# Patient Record
Sex: Male | Born: 1970 | Race: White | Hispanic: No | Marital: Married | Smoking: Never smoker
Health system: Southern US, Community
[De-identification: ages and names within clinical notes are randomized; demographics above are authoritative.]

## PROBLEM LIST (undated history)

## (undated) DIAGNOSIS — R21 Rash and other nonspecific skin eruption: Secondary | ICD-10-CM

## (undated) DIAGNOSIS — R0600 Dyspnea, unspecified: Secondary | ICD-10-CM

## (undated) DIAGNOSIS — E785 Hyperlipidemia, unspecified: Secondary | ICD-10-CM

## (undated) DIAGNOSIS — M25572 Pain in left ankle and joints of left foot: Secondary | ICD-10-CM

## (undated) HISTORY — DX: Dyspnea, unspecified: R06.00

## (undated) HISTORY — DX: Pain in left ankle and joints of left foot: M25.572

## (undated) HISTORY — DX: Hyperlipidemia, unspecified: E78.5

## (undated) HISTORY — DX: Rash and other nonspecific skin eruption: R21

## (undated) HISTORY — PX: OTHER SURGICAL HISTORY: SHX169

---

## 2008-02-16 ENCOUNTER — Ambulatory Visit: Payer: Self-pay | Admitting: Family Medicine

## 2008-02-16 DIAGNOSIS — R21 Rash and other nonspecific skin eruption: Secondary | ICD-10-CM

## 2013-11-17 ENCOUNTER — Encounter: Payer: Self-pay | Admitting: Family Medicine

## 2013-11-17 ENCOUNTER — Ambulatory Visit (INDEPENDENT_AMBULATORY_CARE_PROVIDER_SITE_OTHER): Payer: Managed Care, Other (non HMO) | Admitting: Family Medicine

## 2013-11-17 VITALS — BP 90/57 | HR 74 | Resp 16 | Wt 179.0 lb

## 2013-11-17 DIAGNOSIS — IMO0002 Reserved for concepts with insufficient information to code with codable children: Secondary | ICD-10-CM

## 2013-11-17 DIAGNOSIS — Z131 Encounter for screening for diabetes mellitus: Secondary | ICD-10-CM

## 2013-11-17 DIAGNOSIS — Z13220 Encounter for screening for lipoid disorders: Secondary | ICD-10-CM

## 2013-11-17 DIAGNOSIS — N469 Male infertility, unspecified: Secondary | ICD-10-CM

## 2013-11-17 NOTE — Progress Notes (Signed)
CC: Jesse Brock is a 43 y.o. male is here for Establish Care   Subjective: HPI:  Very pleasant 43 year old here to establish care  Patient expresses concerns regarding infertility between he and his wife. They have been married and engaging in protected sex for the past 10 months without any known pregnancy. She may have had a miscarriage many months ago however there was no definitive answer on whether or not this occurred. She has never been pregnant her knowledge nor has he ever had a pregnancy with any other woman. He denies any genitourinary complaints recently or remotely nor trauma to the testicles.  No interventions as of yet. He denies any fatigue or subjective low libido.  He was taking finasteride up until 2-3 months ago for hair loss however is no longer taking this.  Review of Systems - General ROS: negative for - chills, fever, night sweats, weight gain or weight loss Ophthalmic ROS: negative for - decreased vision Psychological ROS: negative for - anxiety or depression ENT ROS: negative for - hearing change, nasal congestion, tinnitus or allergies Hematological and Lymphatic ROS: negative for - bleeding problems, bruising or swollen lymph nodes Breast ROS: negative Respiratory ROS: no cough, shortness of breath, or wheezing Cardiovascular ROS: no chest pain or dyspnea on exertion Gastrointestinal ROS: no abdominal pain, change in bowel habits, or black or bloody stools Genito-Urinary ROS: negative for - genital discharge, genital ulcers, incontinence or abnormal bleeding from genitals Musculoskeletal ROS: negative for - joint pain or muscle pain Neurological ROS: negative for - headaches or memory loss Dermatological ROS: negative for lumps, mole changes, rash and skin lesion changes  History reviewed. No pertinent past medical history.  History reviewed. No pertinent past surgical history. Family History  Problem Relation Age of Onset  . Lung cancer Father   . Cancer  Father     Lung  . Stroke Brother     History   Social History  . Marital Status: Married    Spouse Name: N/A    Number of Children: N/A  . Years of Education: N/A   Occupational History  . Not on file.   Social History Main Topics  . Smoking status: Never Smoker   . Smokeless tobacco: Never Used  . Alcohol Use: No  . Drug Use: No  . Sexual Activity: Yes   Other Topics Concern  . Not on file   Social History Narrative  . No narrative on file     Objective: BP 90/57  Pulse 74  Resp 16  Wt 179 lb (81.194 kg)  SpO2 98%  General: Alert and Oriented, No Acute Distress HEENT: Pupils equal, round, reactive to light. Conjunctivae clear.  Moist mucous membranes pharynx unremarkable Lungs: Clear to auscultation bilaterally, no wheezing/ronchi/rales.  Comfortable work of breathing. Good air movement. Cardiac: Regular rate and rhythm. Normal S1/S2.  No murmurs, rubs, nor gallops.   Extremities: No peripheral edema.  Strong peripheral pulses.  Mental Status: No depression, anxiety, nor agitation. Skin: Warm and dry.  Assessment & Plan: [name] was seen today for establish care.  Diagnoses and associated orders for this visit:  Infertility - Ambulatory referral to Urology  Lipid screening - Lipid panel  Diabetes mellitus screening - BASIC METABOLIC PANEL WITH GFR    Infertility: we'll refer to urology for consideration of semen analysis and further workup. I've encouraged him to stop finasteride indefinitely as a known side effect of this is infertility He is due for routine dyslipidemia and diabetic screening  Return if symptoms worsen or fail to improve.

## 2013-11-20 LAB — BASIC METABOLIC PANEL WITH GFR
BUN: 15 mg/dL (ref 6–23)
CALCIUM: 9.2 mg/dL (ref 8.4–10.5)
CO2: 30 mEq/L (ref 19–32)
Chloride: 102 mEq/L (ref 96–112)
Creat: 0.9 mg/dL (ref 0.50–1.35)
GLUCOSE: 81 mg/dL (ref 70–99)
Potassium: 4.3 mEq/L (ref 3.5–5.3)
SODIUM: 138 meq/L (ref 135–145)

## 2013-11-20 LAB — LIPID PANEL
CHOL/HDL RATIO: 4.4 ratio
Cholesterol: 157 mg/dL (ref 0–200)
HDL: 36 mg/dL — AB (ref >39)
LDL Cholesterol: 101 mg/dL — ABNORMAL HIGH (ref 0–99)
Triglycerides: 100 mg/dL (ref <150)
VLDL: 20 mg/dL (ref 0–40)

## 2013-11-23 ENCOUNTER — Encounter: Payer: Self-pay | Admitting: *Deleted

## 2013-11-29 ENCOUNTER — Telehealth: Payer: Self-pay | Admitting: *Deleted

## 2013-11-29 ENCOUNTER — Encounter: Payer: Self-pay | Admitting: Family Medicine

## 2013-11-29 ENCOUNTER — Ambulatory Visit (INDEPENDENT_AMBULATORY_CARE_PROVIDER_SITE_OTHER): Payer: Managed Care, Other (non HMO) | Admitting: Family Medicine

## 2013-11-29 VITALS — BP 117/72 | HR 64 | Temp 98.7°F | Wt 170.0 lb

## 2013-11-29 DIAGNOSIS — J45909 Unspecified asthma, uncomplicated: Secondary | ICD-10-CM

## 2013-11-29 MED ORDER — PREDNISONE 20 MG PO TABS: ORAL_TABLET | ORAL | Status: AC

## 2013-11-29 NOTE — Telephone Encounter (Signed)
Pt would like a note stating that he was seen today in the office. Pt will come pick up the letter in the am.letter printed and place up front

## 2013-11-29 NOTE — Progress Notes (Signed)
CC: Jesse Brock is a 43 y.o. male is here for chest congestion?   Subjective: HPI:  Complains of chest tightness and sensation of not being able to get a full breath of air that has been present since Sunday not getting better since onset mild to moderate in severity present all hours of the day. Came on abruptly Sunday morning less than one day after painting his hallway and poor ventilation. He reports mild chest congestion but a nonproductive cough. He and his wife have noted wheezing since the above symptoms began. He denies exertional chest pain nor irregular heartbeat. Denies fevers, chills, flushing, abdominal pain, confusion, sore throat, sinus pressure. Review of systems is positive for mild nasal congestion   Review Of Systems Outlined In HPI  No past medical history on file.  No past surgical history on file. Family History  Problem Relation Age of Onset  . Lung cancer Father   . Cancer Father     Lung  . Stroke Brother     History   Social History  . Marital Status: Married    Spouse Name: N/A    Number of Children: N/A  . Years of Education: N/A   Occupational History  . Not on file.   Social History Main Topics  . Smoking status: Never Smoker   . Smokeless tobacco: Never Used  . Alcohol Use: No  . Drug Use: No  . Sexual Activity: Yes   Other Topics Concern  . Not on file   Social History Narrative  . No narrative on file     Objective: BP 117/72  Pulse 64  Temp(Src) 98.7 F (37.1 C) (Oral)  Wt 170 lb (77.111 kg)  SpO2 99%  General: Alert and Oriented, No Acute Distress HEENT: Pupils equal, round, reactive to light. Conjunctivae clear.  External ears unremarkable, canals clear with intact TMs with appropriate landmarks.  Middle ear appears open without effusion. Pink inferior turbinates with scant clear discharge.  Moist mucous membranes, pharynx without inflammation nor lesions.  Neck supple without palpable lymphadenopathy nor abnormal  masses. Lungs: Clear to auscultation bilaterally, no wheezing/ronchi/rales.  Comfortable work of breathing. Good air movement. Cardiac: Regular rate and rhythm. Normal S1/S2.  No murmurs, rubs, nor gallops.   Mental Status: No depression, anxiety, nor agitation. Skin: Warm and dry.  Assessment & Plan: Jesse Brock was seen today for chest congestion?.  Diagnoses and associated orders for this visit:  Reactive airway disease - predniSONE (DELTASONE) 20 MG tablet; Two tabs at once daily for five days.    Suspect reactive airway disease therefore start moderate dose of prednisone call if any decline in overall health, very low suspicion of viral or bacterial infection at this time  Return if symptoms worsen or fail to improve.

## 2013-11-30 ENCOUNTER — Encounter: Payer: Self-pay | Admitting: Family Medicine

## 2014-01-31 ENCOUNTER — Telehealth: Payer: Self-pay

## 2014-01-31 ENCOUNTER — Ambulatory Visit (INDEPENDENT_AMBULATORY_CARE_PROVIDER_SITE_OTHER): Payer: Managed Care, Other (non HMO) | Admitting: Family Medicine

## 2014-01-31 ENCOUNTER — Ambulatory Visit (INDEPENDENT_AMBULATORY_CARE_PROVIDER_SITE_OTHER): Payer: Managed Care, Other (non HMO)

## 2014-01-31 ENCOUNTER — Encounter: Payer: Self-pay | Admitting: Family Medicine

## 2014-01-31 VITALS — BP 111/75 | HR 63 | Wt 166.0 lb

## 2014-01-31 DIAGNOSIS — M25579 Pain in unspecified ankle and joints of unspecified foot: Secondary | ICD-10-CM

## 2014-01-31 DIAGNOSIS — M25572 Pain in left ankle and joints of left foot: Secondary | ICD-10-CM

## 2014-01-31 DIAGNOSIS — T148 Other injury of unspecified body region: Secondary | ICD-10-CM

## 2014-01-31 DIAGNOSIS — M25473 Effusion, unspecified ankle: Secondary | ICD-10-CM

## 2014-01-31 DIAGNOSIS — W57XXXA Bitten or stung by nonvenomous insect and other nonvenomous arthropods, initial encounter: Secondary | ICD-10-CM

## 2014-01-31 DIAGNOSIS — M25476 Effusion, unspecified foot: Secondary | ICD-10-CM

## 2014-01-31 DIAGNOSIS — M7730 Calcaneal spur, unspecified foot: Secondary | ICD-10-CM

## 2014-01-31 NOTE — Telephone Encounter (Signed)
Jesse Brock called and left a voice mail stating he needs to come in for an office visit. I called him back and left a message that we do have openings and for him to call back to schedule an appointment.

## 2014-01-31 NOTE — Patient Instructions (Signed)
Wear the air-stirup splint on a daily basis whenever you or bearing weight on her left ankle. Continue to rest the ankle, elevated when resting, ice it for 20 minutes a time every 4-5 hours for the next 2 days.  You can use ibuprofen 800 mg over-the-counter 3 times a day as needed for pain control and swelling control.  I think you're going to need to use this splint for 2 weeks. No softball for the next 2 weeks.  Followup with me in 2 weeks if pain is persistent.

## 2014-01-31 NOTE — Progress Notes (Signed)
CC: Jesse Brock is a 43 y.o. male is here for left ankle injury   Subjective: HPI:  Patient complains of left ankle pain that has been present since last night. It came on acutely after he was sliding into third base at a softball match and twisted his ankle. He is uncertain if it was inversion or eversion injury. He was able to bear weight however had immediate pain on the distal lateral and medial malleoli that was nonradiating. Swelling occurred within minutes. Pain is described only as pain moderate in severity not influenced by weightbearing.  Symptoms improved with icing, compression, elevation overnight however pain is persistent.  Other than swelling denies any overlying skin changes. Pain is described only as pain.  Complains of swelling and redness overlying his left lateral hip has been present ever since a tick was removed on Sunday. His wife believes that she did not get the full tick and that the head may still be buried. Patient denies any recent or remote fevers, chills, myalgias, nor joint pain other than that described above. Denies rashes, headache, nor nausea. No discharge or tenderness at the site of swelling and redness.   Review Of Systems Outlined In HPI  No past medical history on file.  No past surgical history on file. Family History  Problem Relation Age of Onset  . Lung cancer Father   . Cancer Father     Lung  . Stroke Brother     History   Social History  . Marital Status: Married    Spouse Name: N/A    Number of Children: N/A  . Years of Education: N/A   Occupational History  . Not on file.   Social History Main Topics  . Smoking status: Never Smoker   . Smokeless tobacco: Never Used  . Alcohol Use: No  . Drug Use: No  . Sexual Activity: Yes   Other Topics Concern  . Not on file   Social History Narrative  . No narrative on file     Objective: BP 111/75  Pulse 63  Wt 166 lb (75.297 kg)  General: Alert and Oriented, No Acute  Distress HEENT: Pupils equal, round, reactive to light. Conjunctivae clear other than a mild stye on the right inferior eyelid.  Moist because membranes are unremarkable Lungs: Clear to auscultation bilaterally, no wheezing/ronchi/rales.  Comfortable work of breathing. Good air movement. Cardiac: Regular rate and rhythm. Normal S1/S2.  No murmurs, rubs, nor gallops.   Extremities: No peripheral edema.  Strong peripheral pulses. Exam of the left foot reveals reproduction of pain with palpation of distal medial or lateral malleoli. No pain over the navicular nor base of fifth metatarsal. Pain is not reproduced with compression of tibia-fibula in the shin. There is a remarkable swelling overlying the medial and lateral malleoli but no bruising. Anterior drawer negative Mental Status: No depression, anxiety, nor agitation. Skin: Warm and dry. 2.5 centimeter diameter patch of erythema with a central scab, slightly tender to touch  Assessment & Plan: Jesse Brock was seen today for left ankle injury.  Diagnoses and associated orders for this visit:  Left ankle pain - DG Ankle Complete Left; Future  Tick bite    X-rays were obtained of the left ankle fortunate showing no sign of avulsion. He was strapped with a air cast splint and encouraged to wear this on a daily basis whenever on his feet, focus on rest, ice, compression, elevation. Ibuprofen as needed for pain. Return if not significantly improved  by 2 weeks. Tick bite: Due to the suspicion of the retained head of a tick 1% lidocaine with epinephrine was used to anesthetize the skin surrounding the scab on his left lateral hip.  Using an 18-gauge needle black debris along with the scab was easily removed.  Wound was covered with antibiotic ointment and a Band-Aid and encouraged the family to do this on a daily basis. The perimeter of erythema was marked with a surgical pen and asked him to call me if erythema extends beyond this as this would be  suggestive of a staph or strep infection requiring antibiotics.  40 minutes spent face-to-face during visit today of which at least 50% was counseling or coordinating care regarding: 1. Left ankle pain   2. Tick bite       Return if symptoms worsen or fail to improve.

## 2014-03-09 ENCOUNTER — Ambulatory Visit (INDEPENDENT_AMBULATORY_CARE_PROVIDER_SITE_OTHER): Payer: Managed Care, Other (non HMO)

## 2014-03-09 ENCOUNTER — Encounter: Payer: Self-pay | Admitting: Family Medicine

## 2014-03-09 ENCOUNTER — Ambulatory Visit (INDEPENDENT_AMBULATORY_CARE_PROVIDER_SITE_OTHER): Payer: Managed Care, Other (non HMO) | Admitting: Family Medicine

## 2014-03-09 VITALS — BP 114/72 | HR 63 | Wt 165.0 lb

## 2014-03-09 DIAGNOSIS — R0602 Shortness of breath: Secondary | ICD-10-CM

## 2014-03-09 MED ORDER — BECLOMETHASONE DIPROPIONATE 80 MCG/ACT IN AERS: 1.0000 | INHALATION_SPRAY | Freq: Two times a day (BID) | RESPIRATORY_TRACT | Status: DC

## 2014-03-09 NOTE — Progress Notes (Signed)
CC: Jesse Brock is a 43 y.o. male is here for Shortness of Breath   Subjective: HPI:  Reports shortness of breath that has been present on a daily basis for the past 3 months. He can occur with exertion or at rest. It is not predictable with respect to environment, only coming on with a certain level of exertion or time of day. It can occur anytime of the day. It does not interfere with his sleep.  Symptoms began after exposure to paint fumes 3 months ago. Nothing particularly makes symptoms better or worse currently. When the episodes come on the last matter of minutes and are described as sensation that he cannot get a full deep breath and often reports he has to gasp for air.  Symptoms have not been getting better or worse since onset. Denies cough, fevers, chills, wheezing, productive cough, exertional chest pain, orthopnea, peripheral edema, nasal congestion or watery eyes.   Review Of Systems Outlined In HPI  No past medical history on file.  No past surgical history on file. Family History  Problem Relation Age of Onset  . Lung cancer Father   . Cancer Father     Lung  . Stroke Brother     History   Social History  . Marital Status: Married    Spouse Name: N/A    Number of Children: N/A  . Years of Education: N/A   Occupational History  . Not on file.   Social History Main Topics  . Smoking status: Never Smoker   . Smokeless tobacco: Never Used  . Alcohol Use: No  . Drug Use: No  . Sexual Activity: Yes   Other Topics Concern  . Not on file   Social History Narrative  . No narrative on file     Objective: BP 114/72  Pulse 63  Wt 165 lb (74.844 kg)  SpO2 98%  General: Alert and Oriented, No Acute Distress HEENT: Pupils equal, round, reactive to light. Conjunctivae clear.  External ears unremarkable, canals clear with intact TMs with appropriate landmarks.  Middle ear appears open without effusion. Pink inferior turbinates.  Moist mucous membranes, pharynx  without inflammation nor lesions.  Neck supple without palpable lymphadenopathy nor abnormal masses. Lungs: Clear to auscultation bilaterally, no wheezing/ronchi/rales.  Comfortable work of breathing. Good air movement. Cardiac: Regular rate and rhythm. Normal S1/S2.  No murmurs, rubs, nor gallops.   Extremities: No peripheral edema.  Strong peripheral pulses.  Mental Status: No depression, anxiety, nor agitation. Skin: Warm and dry.  Assessment & Plan: Jesse FearingJames was seen today for shortness of breath.  Diagnoses and associated orders for this visit:  Shortness of breath - DG Chest 2 View; Future  Other Orders - beclomethasone (QVAR) 80 MCG/ACT inhaler; Inhale 1 puff into the lungs 2 (two) times daily.    Obtain chest x-ray given chronicity of symptoms, looking for cardiomegaly, reactive airway disease remains high on the differential therefore start samples of Qvar for both therapeutic and diagnostic benefits. I've asked him to call me next week with the response.   Return if symptoms worsen or fail to improve.

## 2014-04-18 ENCOUNTER — Telehealth: Payer: Self-pay | Admitting: Family Medicine

## 2014-04-18 DIAGNOSIS — R0602 Shortness of breath: Secondary | ICD-10-CM

## 2014-04-18 NOTE — Telephone Encounter (Signed)
Is requesting to see a pulmonary dr.

## 2014-04-18 NOTE — Telephone Encounter (Signed)
Jesse Brock, Referral has been placed. 

## 2014-04-19 NOTE — Telephone Encounter (Signed)
Left message on vm

## 2014-05-10 ENCOUNTER — Institutional Professional Consult (permissible substitution): Payer: Managed Care, Other (non HMO) | Admitting: Pulmonary Disease

## 2014-05-10 ENCOUNTER — Ambulatory Visit (INDEPENDENT_AMBULATORY_CARE_PROVIDER_SITE_OTHER): Payer: Managed Care, Other (non HMO) | Admitting: Pulmonary Disease

## 2014-05-10 ENCOUNTER — Encounter: Payer: Self-pay | Admitting: Pulmonary Disease

## 2014-05-10 VITALS — BP 110/68 | HR 70 | Temp 98.2°F | Ht 69.0 in | Wt 169.8 lb

## 2014-05-10 DIAGNOSIS — R0989 Other specified symptoms and signs involving the circulatory and respiratory systems: Secondary | ICD-10-CM

## 2014-05-10 DIAGNOSIS — R0609 Other forms of dyspnea: Secondary | ICD-10-CM

## 2014-05-10 DIAGNOSIS — R0600 Dyspnea, unspecified: Secondary | ICD-10-CM | POA: Insufficient documentation

## 2014-05-10 NOTE — Patient Instructions (Signed)
CXR is clear Breathing test shows good lung function Treatment trial for reflux- Take pepcid 20 mg twice daily x 4 weeks, call if no better

## 2014-05-10 NOTE — Progress Notes (Signed)
   Subjective:    Patient ID: Jesse Brock, male    DOB: Feb 01, 1971, 43 y.o.   MRN: [id number]  HPI PCP - hommel 43 year old never smoker, presents for evaluation of dyspnea since March 2015. He reports an episode that woke him up from sleep and lasted for about 2 hours before subsiding spontaneously. He also reports episodes after eating a large meal. He feels that he has some sputum in the back of his throat that he is to clear. He sometimes hears a sound while breathing. He is able to play basketball twice a week however and is not dyspneic while on the court. He denies substernal burning. He clarifies that this is not exactly shortness of breath but more a sensation of heaviness and congestion in his throat. He admits to some anxiety surrounding his symptoms, and felt reassured when he found that his chest x-ray was normal. He denies childhood history of asthma or wheezing. There is no exercise-induced symptoms. There is no history of seasonal allergies. Wife has noted some snoring but not witnessed apneas. 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 He denies any stressors at work or home. Spirometry showed no evidence of airway obstruction, good lung function, expiratory limb shows flattening.  No past medical history on file.  No past surgical history on file.  No Known Allergies  History   Social History  . Marital Status: Married    Spouse Name: N/A    Number of Children: N/A  . Years of Education: N/A   Occupational History  . Not on file.   Social History Main Topics  . Smoking status: Never Smoker   . Smokeless tobacco: Never Used  . Alcohol Use: No  . Drug Use: No  . Sexual Activity: Yes   Other Topics Concern  . Not on file   Social History Narrative  . No narrative on file    Family History  Problem Relation Age of Onset  . Lung cancer Father     smoked  . Stroke Brother       Review of  Systems Constitutional: negative for anorexia, fevers and sweats  Eyes: negative for irritation, redness and visual disturbance  Ears, nose, mouth, throat, and face: negative for earaches, epistaxis, nasal congestion and sore throat  Respiratory: negative for cough, dyspnea on exertion, sputum and wheezing  Cardiovascular: negative for chest pain, dyspnea, lower extremity edema, orthopnea, palpitations and syncope  Gastrointestinal: negative for abdominal pain, constipation, diarrhea, melena, nausea and vomiting  Genitourinary:negative for dysuria, frequency and hematuria  Hematologic/lymphatic: negative for bleeding, easy bruising and lymphadenopathy  Musculoskeletal:negative for arthralgias, muscle weakness and stiff joints  Neurological: negative for coordination problems, gait problems, headaches and weakness  Endocrine: negative for diabetic symptoms including polydipsia, polyuria and weight loss     Objective:   Physical Exam  Gen. Pleasant, well-nourished, in no distress, normal affect ENT - no lesions, no post nasal drip Neck: No JVD, no thyromegaly, no carotid bruits Lungs: no use of accessory muscles, no dullness to percussion, clear without rales or rhonchi  Cardiovascular: Rhythm regular, heart sounds  normal, no murmurs or gallops, no peripheral edema Abdomen: soft and non-tender, no hepatosplenomegaly, BS normal. Musculoskeletal: No deformities, no cyanosis or clubbing Neuro:  alert, non focal        Assessment & Plan:

## 2014-05-10 NOTE — Assessment & Plan Note (Signed)
His symptoms seem to be worse at rest and on exertion, etiology is not clear but reflux appears most likely. He does not seem to have allergies CXR is clear spirometry shows good lung function,no airway obstruction, although some flattening of the expiratory limb is noted. Treatment trial for reflux- Take pepcid 20 mg twice daily x 4 weeks, call if no better and we can pursue further workup

## 2014-06-22 ENCOUNTER — Encounter: Payer: Self-pay | Admitting: Family Medicine

## 2014-06-22 ENCOUNTER — Ambulatory Visit (INDEPENDENT_AMBULATORY_CARE_PROVIDER_SITE_OTHER): Payer: Managed Care, Other (non HMO) | Admitting: Family Medicine

## 2014-06-22 VITALS — BP 101/58 | HR 66 | Ht 69.0 in | Wt 168.0 lb

## 2014-06-22 DIAGNOSIS — M25572 Pain in left ankle and joints of left foot: Secondary | ICD-10-CM

## 2014-06-22 NOTE — Progress Notes (Signed)
CC: Jesse Brock is a 43 y.o. male is here for Ankle Injury   Subjective: HPI:  Complains of left ankle pain that has been present since May of this year. Symptoms have been moderately persistent over the summer. He improved a few weeks after an inversion injury with rest however no improvement over the past 3-4 months. Nothing particularly makes the pain better or worse. He is still able to play recreational basketball however symptoms are present on a daily basis. No benefits from home rehabilitative exercises. There is moderate swelling on a daily basis localized on the lateral side ankle. Pain is nonradiating and described only has pain. Denies weakness or motor or sensory disturbances in the left lower extremity. Denies any overlying skin changes other than that described above  Review Of Systems Outlined In HPI  No past medical history on file.  No past surgical history on file. Family History  Problem Relation Age of Onset  . Lung cancer Father     smoked  . Stroke Brother     History   Social History  . Marital Status: Married    Spouse Name: N/A    Number of Children: N/A  . Years of Education: N/A   Occupational History  . Not on file.   Social History Main Topics  . Smoking status: Never Smoker   . Smokeless tobacco: Never Used  . Alcohol Use: No  . Drug Use: No  . Sexual Activity: Yes   Other Topics Concern  . Not on file   Social History Narrative  . No narrative on file     Objective: BP 101/58  Pulse 66  Ht 5\' 9"  (1.753 m)  Wt 168 lb (76.204 kg)  BMI 24.80 kg/m2  General: Alert and Oriented, No Acute Distress HEENT: Pupils equal, round, reactive to light. Conjunctivae clear.  Moist mucous membranes Lungs: Clear and comfortable work of breathing Cardiac: Regular rate and rhythm.  Extremities: No peripheral edema.  Strong peripheral pulses. Exam of the left ankle shows negative anterior drawer, no pain at the base of the fifth metatarsal, no pain  with palpation of the medial malleoli, mild to moderate swelling over the lateral malleoli. No pain in her ankle with compression of the tibia and tibia. Pain is reproduced with palpation of the anterior and posterior lateral malleoli and with resisted ankle eversion. Mental Status: No depression, anxiety, nor agitation. Skin: Warm and dry.  Assessment & Plan: Jesse Brock was seen today for ankle injury.  Diagnoses and associated orders for this visit:  Left ankle pain - MR Ankle Left  Wo Contrast; Future    Persistent left ankle pain despite conservative measures. Obtain MRI to determine if ligamentous, bone or cartilage damage could benefit from any surgical intervention.   Return if symptoms worsen or fail to improve.

## 2014-06-25 ENCOUNTER — Telehealth: Payer: Self-pay

## 2014-06-25 NOTE — Telephone Encounter (Signed)
PA for MRI approved for [facility] authorization # [id number]

## 2014-07-06 ENCOUNTER — Telehealth: Payer: Self-pay | Admitting: Family Medicine

## 2014-07-06 DIAGNOSIS — M19172 Post-traumatic osteoarthritis, left ankle and foot: Secondary | ICD-10-CM

## 2014-07-06 DIAGNOSIS — M65979 Unspecified synovitis and tenosynovitis, unspecified ankle and foot: Secondary | ICD-10-CM

## 2014-07-06 DIAGNOSIS — M25572 Pain in left ankle and joints of left foot: Secondary | ICD-10-CM | POA: Insufficient documentation

## 2014-07-06 DIAGNOSIS — S93422A Sprain of deltoid ligament of left ankle, initial encounter: Secondary | ICD-10-CM

## 2014-07-06 DIAGNOSIS — M659 Synovitis and tenosynovitis, unspecified: Secondary | ICD-10-CM

## 2014-07-06 NOTE — Telephone Encounter (Signed)
Contacted patient regarding recent MRI results. Discussed case with Dr. Karie Schwalbe. who has proposed immobilization with casting. I left a voicemail on the patient's answering machine with this advice and urged him to call us to be scheduled with Dr. Karie Schwalbe. at his convenience.

## 2014-07-11 ENCOUNTER — Encounter: Payer: Self-pay | Admitting: Family Medicine

## 2014-07-12 ENCOUNTER — Encounter: Payer: Self-pay | Admitting: Sports Medicine

## 2014-07-12 ENCOUNTER — Ambulatory Visit (INDEPENDENT_AMBULATORY_CARE_PROVIDER_SITE_OTHER): Payer: Managed Care, Other (non HMO) | Admitting: Sports Medicine

## 2014-07-12 VITALS — BP 90/55 | HR 67 | Ht 69.0 in | Wt 168.0 lb

## 2014-07-12 DIAGNOSIS — M25572 Pain in left ankle and joints of left foot: Secondary | ICD-10-CM

## 2014-07-12 DIAGNOSIS — G5752 Tarsal tunnel syndrome, left lower limb: Secondary | ICD-10-CM

## 2014-07-12 MED ORDER — MELOXICAM 15 MG PO TABS: ORAL_TABLET | ORAL | Status: DC

## 2014-07-12 NOTE — Progress Notes (Signed)
   Subjective:    I'm seeing this patient as a consultation for:  Dr. Ivan AnchorsHommel  CC: Left ankle pain  HPI: This is a very pleasant 43 year old male, he inverted his ankle approximately 5 months ago, unfortunately he has had persistent swelling with only minimal pain since. The swelling has been localized over the sinus tarsi. He has not yet been immobilized or done physical therapy. He was seen by his primary care physician who ordered an MRI, and he was referred to me for further evaluation and definitive treatment.  Past medical history, Surgical history, Family history not pertinant except as noted below, Social history, Allergies, and medications have been entered into the medical record, reviewed, and no changes needed.   Review of Systems: No headache, visual changes, nausea, vomiting, diarrhea, constipation, dizziness, abdominal pain, skin rash, fevers, chills, night sweats, weight loss, swollen lymph nodes, body aches, joint swelling, muscle aches, chest pain, shortness of breath, mood changes, visual or auditory hallucinations.   Objective:   General: Well Developed, well nourished, and in no acute distress.  Neuro/Psych: Alert and oriented x3, extra-ocular muscles intact, able to move all 4 extremities, sensation grossly intact. Skin: Warm and dry, no rashes noted.  Respiratory: Not using accessory muscles, speaking in full sentences, trachea midline.  Cardiovascular: Pulses palpable, no extremity edema. Abdomen: Does not appear distended. Left Ankle: Visibly swollen with tenderness to palpation of sinus tarsi/anterior/lateral talocrural joint Range of motion is full in all directions. Strength is 5/5 in all directions. Stable lateral and medial ligaments; squeeze test and kleiger test unremarkable; Talar dome nontender; No pain at base of 5th MT; No tenderness over cuboid; No tenderness over N spot or navicular prominence No tenderness on posterior aspects of lateral and medial  malleolus No sign of peroneal tendon subluxations; Negative tarsal tunnel tinel's Able to walk 4 steps.  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  Impression and Recommendations:   This case required medical decision making of moderate complexity.

## 2014-07-12 NOTE — Assessment & Plan Note (Addendum)
MRI shows fairly extensive changes after an injury 5 months ago. MRI usually will overdiagnosis clinical syndrome. 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 CAM boot for a month, Mobic. When he comes out if he still has pain and swelling we will inject into the sinus tarsi/lateral ankle mortise. I would also like him to return for custom orthotics.

## 2014-08-13 ENCOUNTER — Ambulatory Visit (INDEPENDENT_AMBULATORY_CARE_PROVIDER_SITE_OTHER): Payer: Managed Care, Other (non HMO) | Admitting: Sports Medicine

## 2014-08-13 ENCOUNTER — Encounter: Payer: Self-pay | Admitting: Sports Medicine

## 2014-08-13 DIAGNOSIS — G5752 Tarsal tunnel syndrome, left lower limb: Secondary | ICD-10-CM

## 2014-08-13 DIAGNOSIS — M25572 Pain in left ankle and joints of left foot: Secondary | ICD-10-CM

## 2014-08-13 NOTE — Progress Notes (Signed)
  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  Past medical history, Surgical history, Family history not pertinant except as noted below, Social history, Allergies, and medications have been entered into the medical record, reviewed, and no changes needed.   Review of Systems: No fevers, chills, night sweats, weight loss, chest pain, or shortness of breath.   Objective:    General: Well Developed, well nourished, and in no acute distress.  Neuro: Alert and oriented x3, extra-ocular muscles intact, sensation grossly intact.  HEENT: Normocephalic, atraumatic, pupils equal round reactive to light, neck supple, no masses, no lymphadenopathy, thyroid nonpalpable.  Skin: Warm and dry, no rashes. Cardiac: Regular rate and rhythm, no murmurs rubs or gallops, no lower extremity edema.  Respiratory: Clear to auscultation bilaterally. Not using accessory muscles, speaking in full sentences. Left Ankle: Slight visible swelling of the anterior sinus tarsi Range of motion is full in all directions. Strength is 5/5 in all directions. Stable lateral and medial ligaments; squeeze test and kleiger test unremarkable; Talar dome nontender; No pain at base of 5th MT; No tenderness over cuboid; No tenderness over N spot or navicular prominence No tenderness on posterior aspects of lateral and medial malleolus No sign of peroneal tendon subluxations; Negative tarsal tunnel tinel's Able to walk 4 steps.  Impression and Recommendations:

## 2014-08-13 NOTE — Assessment & Plan Note (Signed)
Multiple derangements on MRI including chronic deltoid ligament tear, extensor digitorum longus and peroneal tendinitis as well as sinus tarsi syndrome. After boot immobilization for a month he is completely pain-free. At this point we are going to proceed with physical therapy for rehabilitation of the ankle. No medications needed. Return to see me in 6 weeks. If any deficits we will certainly consider custom orthotics and sinus tarsi injection, however I could not be more pleased with how he is doing.

## 2014-08-17 ENCOUNTER — Ambulatory Visit (INDEPENDENT_AMBULATORY_CARE_PROVIDER_SITE_OTHER): Payer: Managed Care, Other (non HMO) | Admitting: Physical Therapy

## 2014-08-17 DIAGNOSIS — M6281 Muscle weakness (generalized): Secondary | ICD-10-CM

## 2014-08-17 DIAGNOSIS — M25673 Stiffness of unspecified ankle, not elsewhere classified: Secondary | ICD-10-CM

## 2014-08-17 DIAGNOSIS — R600 Localized edema: Secondary | ICD-10-CM

## 2014-08-22 ENCOUNTER — Encounter (INDEPENDENT_AMBULATORY_CARE_PROVIDER_SITE_OTHER): Payer: Managed Care, Other (non HMO) | Admitting: Physical Therapy

## 2014-08-22 DIAGNOSIS — M25673 Stiffness of unspecified ankle, not elsewhere classified: Secondary | ICD-10-CM

## 2014-08-22 DIAGNOSIS — R600 Localized edema: Secondary | ICD-10-CM

## 2014-08-22 DIAGNOSIS — G5752 Tarsal tunnel syndrome, left lower limb: Secondary | ICD-10-CM

## 2014-08-22 DIAGNOSIS — M6281 Muscle weakness (generalized): Secondary | ICD-10-CM

## 2014-08-24 ENCOUNTER — Encounter (INDEPENDENT_AMBULATORY_CARE_PROVIDER_SITE_OTHER): Payer: Managed Care, Other (non HMO) | Admitting: Physical Therapy

## 2014-08-24 DIAGNOSIS — R600 Localized edema: Secondary | ICD-10-CM

## 2014-08-24 DIAGNOSIS — M6281 Muscle weakness (generalized): Secondary | ICD-10-CM

## 2014-08-24 DIAGNOSIS — M25673 Stiffness of unspecified ankle, not elsewhere classified: Secondary | ICD-10-CM

## 2014-08-24 DIAGNOSIS — G5752 Tarsal tunnel syndrome, left lower limb: Secondary | ICD-10-CM

## 2014-08-28 ENCOUNTER — Encounter: Payer: Managed Care, Other (non HMO) | Admitting: Physical Therapy

## 2014-08-30 ENCOUNTER — Encounter (INDEPENDENT_AMBULATORY_CARE_PROVIDER_SITE_OTHER): Payer: Managed Care, Other (non HMO) | Admitting: Physical Therapy

## 2014-08-30 DIAGNOSIS — G5752 Tarsal tunnel syndrome, left lower limb: Secondary | ICD-10-CM

## 2014-08-30 DIAGNOSIS — R600 Localized edema: Secondary | ICD-10-CM

## 2014-08-30 DIAGNOSIS — M6281 Muscle weakness (generalized): Secondary | ICD-10-CM

## 2014-08-30 DIAGNOSIS — M25673 Stiffness of unspecified ankle, not elsewhere classified: Secondary | ICD-10-CM

## 2014-08-31 ENCOUNTER — Encounter (INDEPENDENT_AMBULATORY_CARE_PROVIDER_SITE_OTHER): Payer: Managed Care, Other (non HMO) | Admitting: Physical Therapy

## 2014-08-31 DIAGNOSIS — R600 Localized edema: Secondary | ICD-10-CM

## 2014-08-31 DIAGNOSIS — M6281 Muscle weakness (generalized): Secondary | ICD-10-CM

## 2014-08-31 DIAGNOSIS — G5752 Tarsal tunnel syndrome, left lower limb: Secondary | ICD-10-CM

## 2014-08-31 DIAGNOSIS — M25673 Stiffness of unspecified ankle, not elsewhere classified: Secondary | ICD-10-CM

## 2014-09-24 ENCOUNTER — Ambulatory Visit: Payer: Managed Care, Other (non HMO) | Admitting: Sports Medicine

## 2014-09-24 DIAGNOSIS — Z0289 Encounter for other administrative examinations: Secondary | ICD-10-CM

## 2015-10-28 ENCOUNTER — Encounter: Payer: Self-pay | Admitting: *Deleted

## 2015-10-28 ENCOUNTER — Emergency Department
Admission: EM | Admit: 2015-10-28 | Discharge: 2015-10-28 | Disposition: A | Discharge status: Home / Self Care | Payer: Managed Care, Other (non HMO) | Source: Home / Self Care | Attending: Family Medicine | Admitting: Family Medicine

## 2015-10-28 DIAGNOSIS — J069 Acute upper respiratory infection, unspecified: Secondary | ICD-10-CM

## 2015-10-28 DIAGNOSIS — J029 Acute pharyngitis, unspecified: Secondary | ICD-10-CM | POA: Diagnosis not present

## 2015-10-28 LAB — POCT RAPID STREP A (OFFICE): Rapid Strep A Screen: NEGATIVE

## 2015-10-28 NOTE — ED Provider Notes (Signed)
CSN: [id number]     Arrival date & time 10/28/15  0901 History   First MD Initiated Contact with Patient 10/28/15 1012     Chief Complaint  Patient presents with  . Sore Throat      HPI Comments: Patient complains of two day history of typical cold-like symptoms including mild sore throat, sinus congestion, headache, and fatigue.  The history is provided by the patient.    History reviewed. No pertinent past medical history. History reviewed. No pertinent past surgical history. Family History  Problem Relation Age of Onset  . Lung cancer Father     smoked  . Stroke Brother    Social History  Substance Use Topics  . Smoking status: Never Smoker   . Smokeless tobacco: Never Used  . Alcohol Use: No    Review of Systems + sore throat No cough+ sneezing No pleuritic pain No wheezing + nasal congestion ? post-nasal drainage No sinus pain/pressure No itchy/red eyes No earache No hemoptysis No SOB No fever/chills No nausea No vomiting No abdominal pain No diarrhea No urinary symptoms No skin rash + fatigue No myalgias + headache Used OTC meds without relief  Allergies  Review of patient's allergies indicates no known allergies.  Home Medications   Prior to Admission medications   Not on File   Meds Ordered and Administered this Visit  Medications - No data to display  BP 114/71 mmHg  Pulse 63  Temp(Src) 98.3 F (36.8 C) (Oral)  Resp 16  Ht  (1.753 m)  Wt 179 lb (81.194 kg)  BMI 26.42 kg/m2  SpO2 100% No data found.   Physical Exam Nursing notes and Vital Signs reviewed. Appearance:  Patient appears stated age, and in no acute distress Eyes:  Pupils are equal, round, and reactive to light and accomodation.  Extraocular movement is intact.  Conjunctivae are not inflamed  Ears:  Canals normal.  Tympanic membranes normal.  Nose:  Mildly congested turbinates.  No sinus tenderness.    Pharynx:  Normal Neck:  Supple.  Tender enlarged posterior  nodes are palpated bilaterally  Lungs:  Clear to auscultation.  Breath sounds are equal.  Moving air well. Heart:  Regular rate and rhythm without murmurs, rubs, or gallops.  Abdomen:  Nontender without masses or hepatosplenomegaly.  Bowel sounds are present.  No CVA or flank tenderness.  Extremities:  No edema.  Skin:  No rash present.   ED Course  Procedures  None    Labs Reviewed  STREP A DNA PROBE  POCT RAPID STREP A (OFFICE) negative      MDM   1. Acute pharyngitis, unspecified etiology   2. Viral URI    Throat culture pending. There is no evidence of bacterial infection today.  Treat symptomatically for now  As cold symptoms develop, try the following: Take plain guaifenesin (  extended release tabs such as Mucinex) twice daily, with plenty of water, for cough and congestion.  May add Pseudoephedrine ( , one or two every 4 to 6 hours) for sinus congestion.  Get adequate rest.   May use Afrin nasal spray (or generic oxymetazoline) twice daily for about 5 days and then discontinue.  Also recommend using saline nasal spray several times daily and saline nasal irrigation (AYR is a common brand).   Try warm salt water gargles for sore throat.  Stop all antihistamines for now, and other non-prescription cough/cold preparations. May take Ibuprofen , 4 tabs every 8 hours with food for sore throat, headache,  etc. May take Delsym Cough Suppressant at bedtime for nighttime cough.    Follow-up with family doctor if not improving about10 days.      Lattie Haw, MD 10/28/15 1034

## 2015-10-28 NOTE — ED Notes (Signed)
Pt c/o sore throat and runny nose x 2 days. Denies fever or cough.

## 2015-10-28 NOTE — Discharge Instructions (Signed)
As cold symptoms develop, try the following: Take plain guaifenesin (  extended release tabs such as Mucinex) twice daily, with plenty of water, for cough and congestion.  May add Pseudoephedrine ( , one or two every 4 to 6 hours) for sinus congestion.  Get adequate rest.   May use Afrin nasal spray (or generic oxymetazoline) twice daily for about 5 days and then discontinue.  Also recommend using saline nasal spray several times daily and saline nasal irrigation (AYR is a common brand).   Try warm salt water gargles for sore throat.  Stop all antihistamines for now, and other non-prescription cough/cold preparations. May take Ibuprofen , 4 tabs every 8 hours with food for sore throat, headache, etc. May take Delsym Cough Suppressant at bedtime for nighttime cough.    Follow-up with family doctor if not improving about10 days.

## 2015-10-29 ENCOUNTER — Telehealth: Payer: Self-pay | Admitting: *Deleted

## 2015-10-29 LAB — STREP A DNA PROBE: GASP: NOT DETECTED

## 2016-02-12 ENCOUNTER — Encounter: Payer: Self-pay | Admitting: Family Medicine

## 2016-02-12 ENCOUNTER — Ambulatory Visit (INDEPENDENT_AMBULATORY_CARE_PROVIDER_SITE_OTHER): Payer: Managed Care, Other (non HMO) | Admitting: Family Medicine

## 2016-02-12 VITALS — BP 117/80 | HR 71 | Wt 176.0 lb

## 2016-02-12 DIAGNOSIS — R0600 Dyspnea, unspecified: Secondary | ICD-10-CM

## 2016-02-12 DIAGNOSIS — G478 Other sleep disorders: Secondary | ICD-10-CM

## 2016-02-12 DIAGNOSIS — G4730 Sleep apnea, unspecified: Secondary | ICD-10-CM | POA: Diagnosis not present

## 2016-02-12 NOTE — Progress Notes (Signed)
CC: Jesse Brock is a 45 y.o. male is here for Shortness of Breath and Referral   Subjective: HPI:  For matter of months he's been dealing with snoring and witnessed apneic episodes on an almost nightly basis. He's been waking up after an apneic episode and acting like he is extremely short of breath. He's noticing that his shortness of breath has also returned to a mild degree during daytime hours. He's never had a sleep study before and does not sleep walk. He is not using any sedatives. Symptoms have been worsening for mild to now moderate in severity. He denies any wheezing, chest discomfort or cough. He denies any nasal congestion or postnasal drip. Interventions including elevating the head of the bed but nothing seems to make it better or worse.    Review Of Systems Outlined In HPI  History reviewed. No pertinent past medical history.  History reviewed. No pertinent past surgical history. Family History  Problem Relation Age of Onset  . Lung cancer Father     smoked  . Stroke Brother     Social History   Social History  . Marital Status: Married    Spouse Name: N/A  . Number of Children: N/A  . Years of Education: N/A   Occupational History  . Not on file.   Social History Main Topics  . Smoking status: Never Smoker   . Smokeless tobacco: Never Used  . Alcohol Use: No  . Drug Use: No  . Sexual Activity: Yes   Other Topics Concern  . Not on file   Social History Narrative     Objective: BP 117/80 mmHg  Pulse 71  Wt 176 lb (79.833 kg)  SpO2 99%  General: Alert and Oriented, No Acute Distress HEENT: Pupils equal, round, reactive to light. Conjunctivae clear.  Moist mucous membranes pharynx unremarkable Lungs: Clear to auscultation bilaterally, no wheezing/ronchi/rales.  Comfortable work of breathing. Good air movement. Cardiac: Regular rate and rhythm. Normal S1/S2.  No murmurs, rubs, nor gallops.   Extremities: No peripheral edema.  Strong peripheral pulses.   Mental Status: No depression, anxiety, nor agitation. Skin: Warm and dry.  Assessment & Plan: [name] was seen today for shortness of breath and referral.  Diagnoses and all orders for this visit:  Dyspnea -     Ambulatory referral to Cardiology  Sleep apnea -     Home sleep test  Non-restorative sleep -     Home sleep test   Nonrestorative sleep with witnessed apneic episodes, further workup will include a home sleep test to see if he might benefit from CPAP. Given his having dyspnea both at night and during the daytime he would like to have a cardiology referral which I think is appropriate especially given family history of cardiac disease.  Return if symptoms worsen or fail to improve.

## 2016-03-05 IMAGING — CR DG ANKLE COMPLETE 3+V*L*
3 series · 3 of 3 positions shown · non-contrast
Comparison: None.

CLINICAL DATA: Rolled ankle yesterday with pain medially and
laterally

EXAM:
LEFT ANKLE COMPLETE - 3+ VIEW

[view not recorded (1 of 3)]
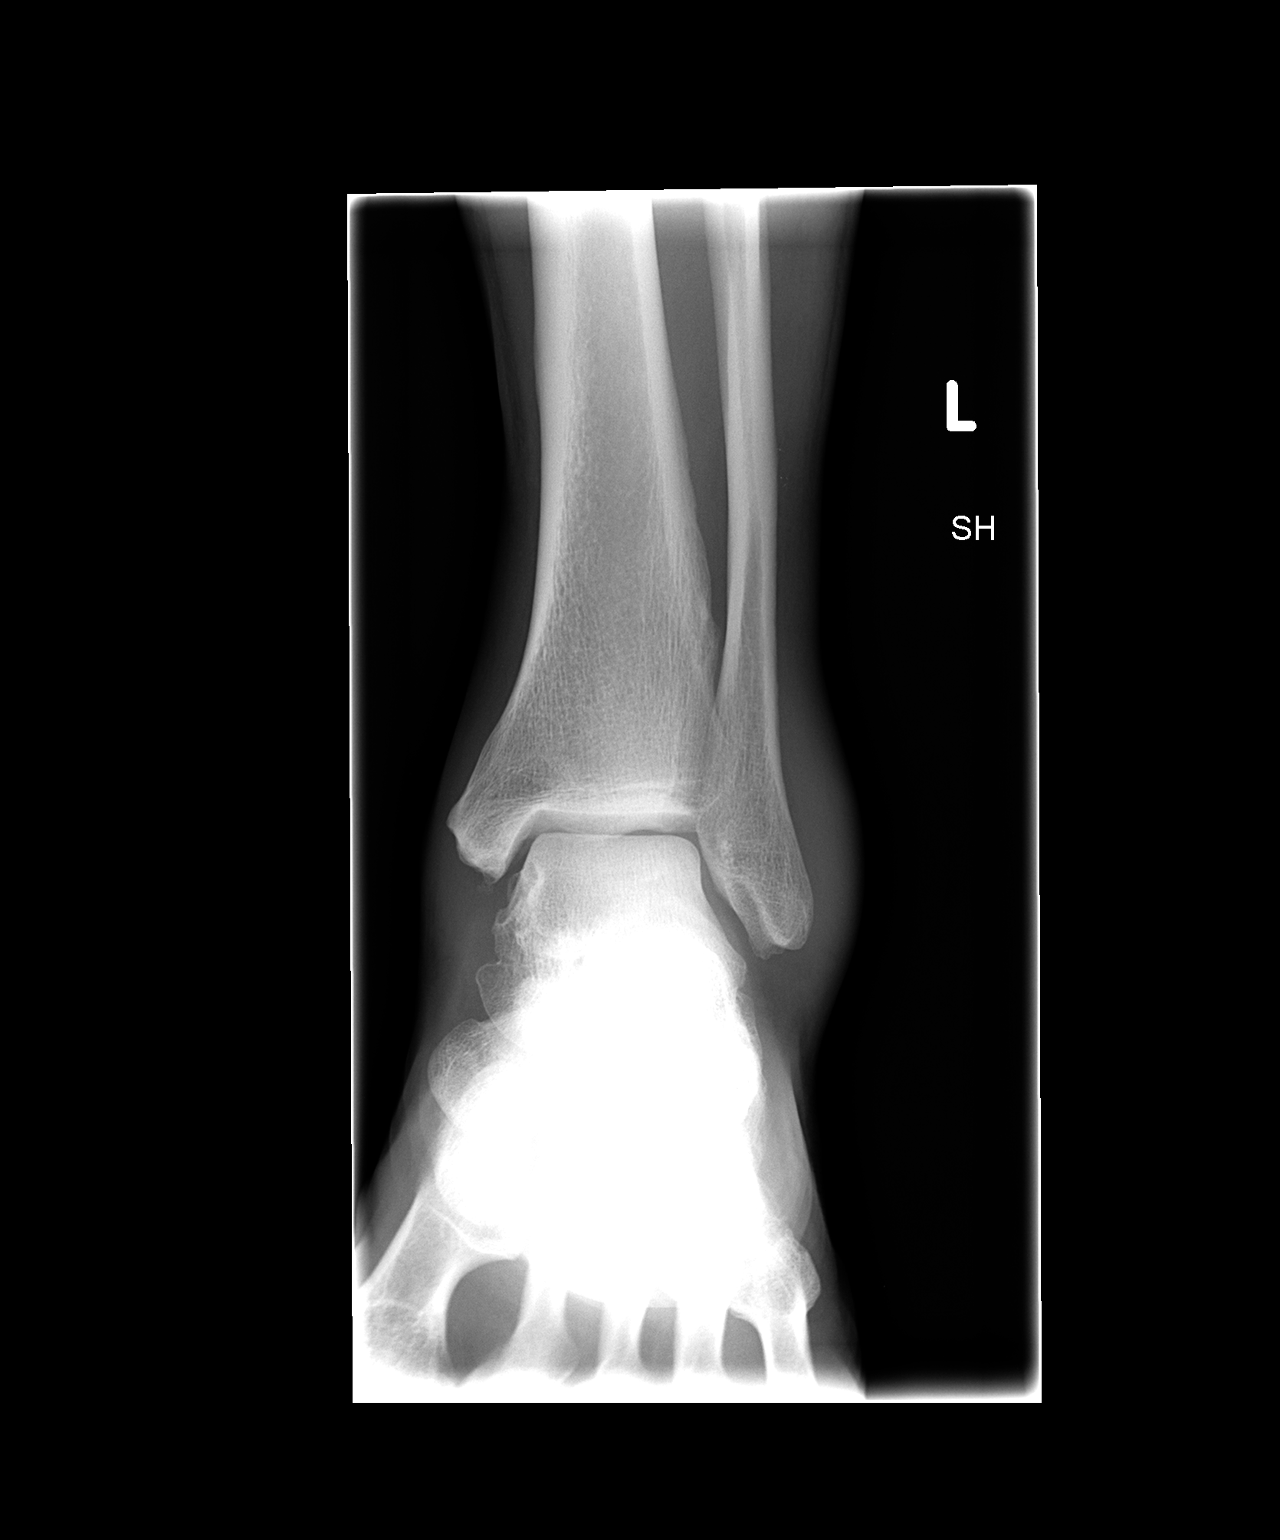

[view not recorded (2 of 3)]
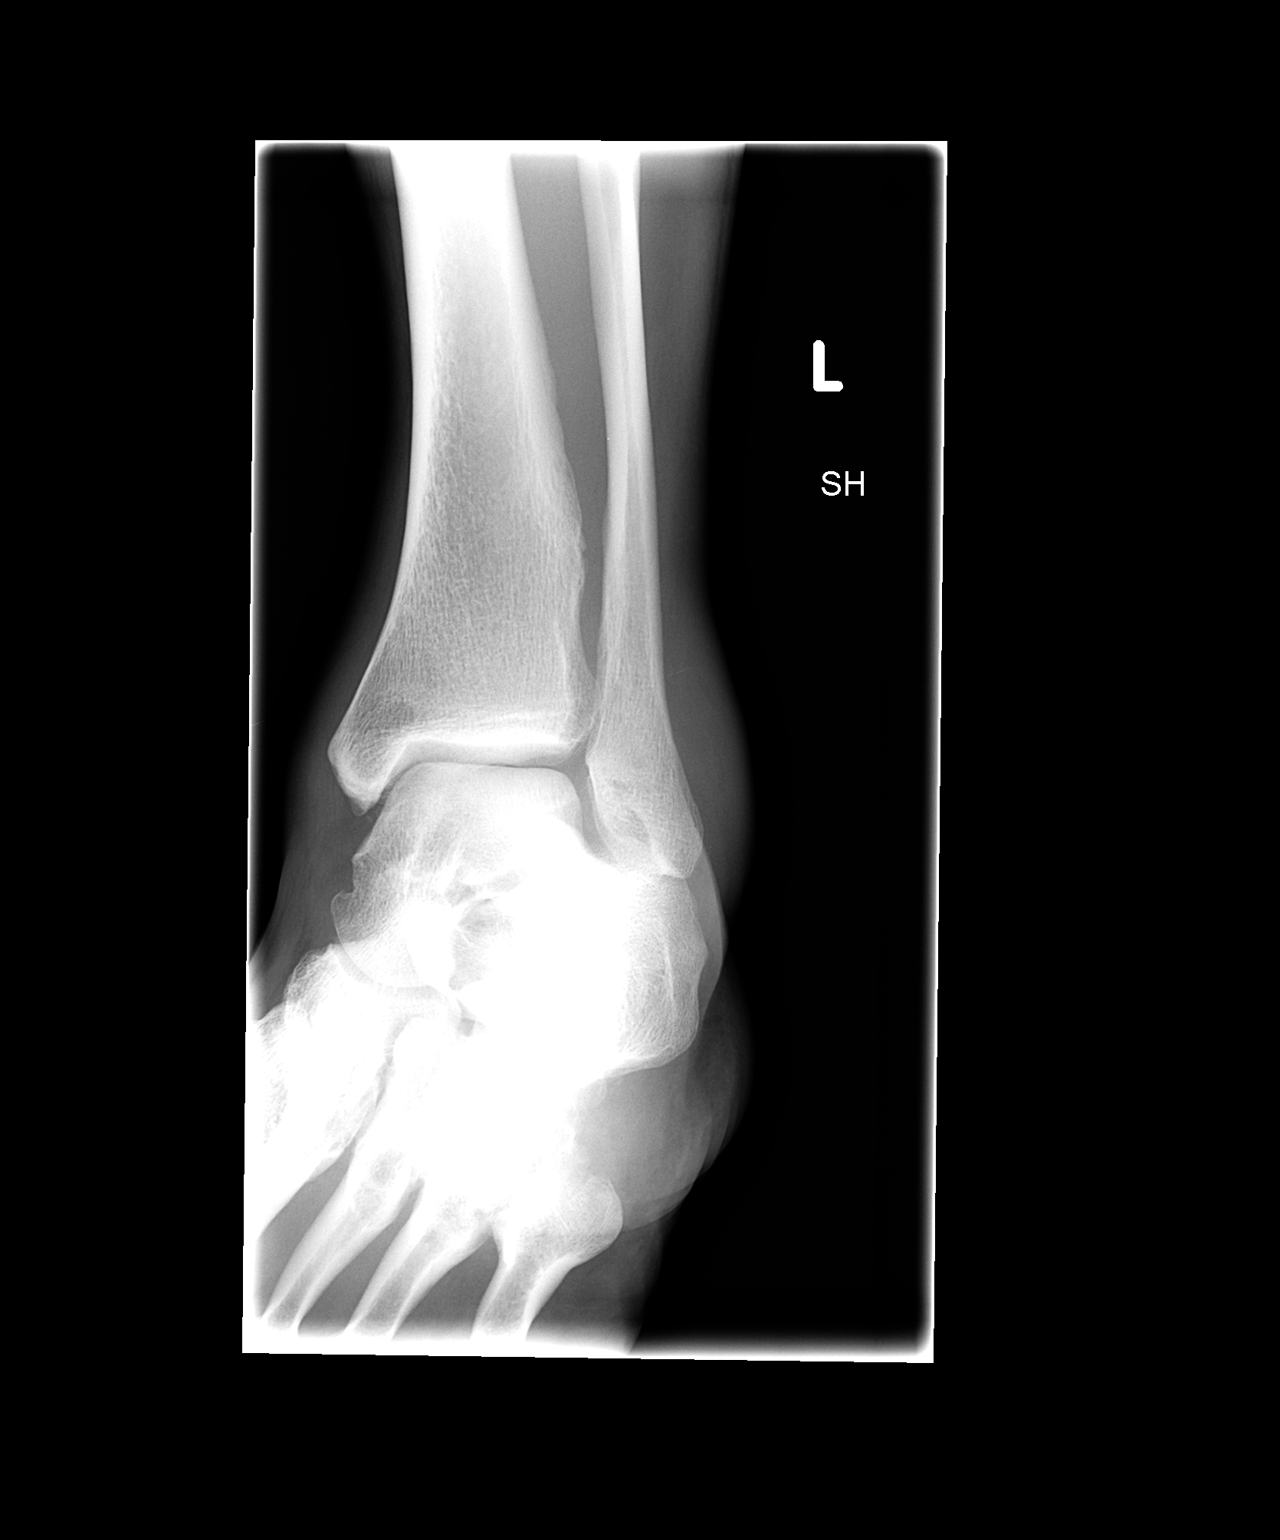

[view not recorded (3 of 3)]
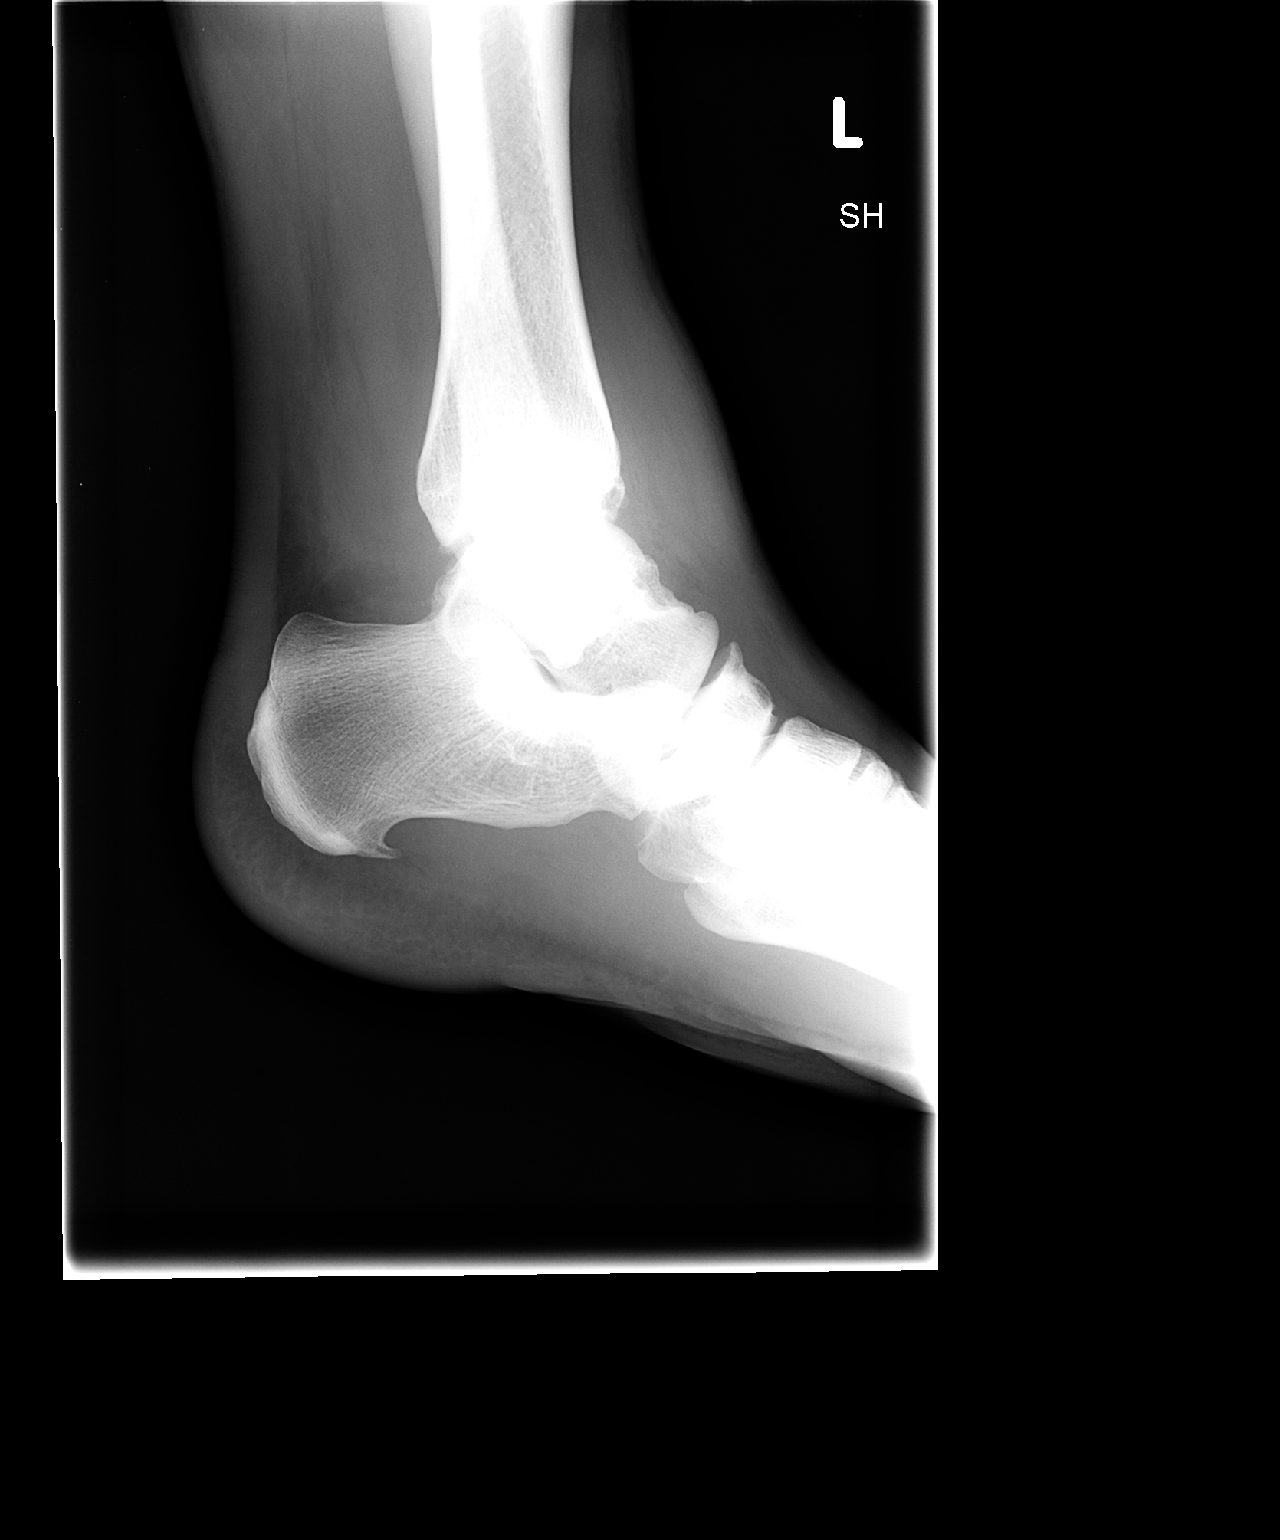

[3 of 3 positions shown; findings below may reference images not displayed]

FINDINGS: There is soft tissue swelling over the anterior and lateral left
ankle. However no acute fracture is seen. Alignment is normal. The
ankle joint appears normal. A small plantar calcaneal degenerative
spur is noted.
IMPRESSION: No acute fracture

## 2016-03-16 ENCOUNTER — Encounter (HOSPITAL_BASED_OUTPATIENT_CLINIC_OR_DEPARTMENT_OTHER): Payer: Managed Care, Other (non HMO)

## 2016-03-23 ENCOUNTER — Encounter: Payer: Self-pay | Admitting: Cardiology

## 2016-03-26 NOTE — Progress Notes (Signed)
Cardiology Office Note    Date:  04/01/2016   ID:  Jesse Brock, DOB 03/09/1971, MRN [id number]  PCP:  Laren BoomHommel, Sean, DO  Cardiologist:  Olga MillersBrian Zya Finkle, MD    History of Present Illness:  Jesse Brock is a 45 y.o. male for evaluation of dyspnea. Patient states that he has had occasions of waking suddenly at night with dyspnea associated with chest tightness. The symptoms last approximately 30 minutes. His tightness is not pleuritic, positional or exertional. He has some diaphoresis. No nausea or vomiting. He otherwise does not have dyspnea on exertion, orthopnea, PND, pedal edema, exertional chest pain or syncope. He is concerned about his symptoms as his father had PCI of 2 vessels in his 3440s. Because of the above we were asked to evaluate.      Past Medical History  Diagnosis Date  . Dyspnea   . Skin rash   . Sinus tarsi syndrome of left ankle   . Hyperlipidemia     Past Surgical History  Procedure Laterality Date  . No prior surgery      Current Medications: No outpatient prescriptions prior to visit.   No facility-administered medications prior to visit.     Allergies:   Review of patient's allergies indicates no known allergies.   Social History   Social History  . Marital Status: Married    Spouse Name: N/A  . Number of Children: 1  . Years of Education: N/A   Social History Main Topics  . Smoking status: Never Smoker   . Smokeless tobacco: Never Used  . Alcohol Use: 0.0 oz/week    0 Standard drinks or equivalent per week     Comment: Occasional  . Drug Use: No  . Sexual Activity: Yes   Other Topics Concern  . None   Social History Narrative     Family History:  The patient's family history includes CAD in his father; Lung cancer in his father; Stroke in his brother.   ROS:   Please see the history of present illness.    No weight loss, productive cough, hemoptysis, dysphagia, odynophagia, melena, hematochezia, dysuria, hematuria, rash, seizure  activity, orthopnea, PND, pedal edema, claudication. All remaining systems negative.   PHYSICAL EXAM:   VS:  BP 96/68 mmHg  Pulse 69  Ht 5\' 9"  (1.753 m)  Wt 167 lb 12.8 oz (76.114 kg)  BMI 24.77 kg/m2   GEN: Well nourished, well developed, in no acute distress HEENT: normal Neck: no JVD, carotid bruits, or masses Cardiac: RRR; no murmurs, rubs, or gallops,no edema  Respiratory:  clear to auscultation bilaterally, normal work of breathing GI: soft, nontender, nondistended, + BS MS: no deformity or atrophy Skin: warm and dry, no rash Neuro:  Alert and Oriented x 3, Strength and sensation are intact Psych: euthymic mood, full affect  Wt Readings from Last 3 Encounters:  04/01/16 167 lb 12.8 oz (76.114 kg)  02/12/16 176 lb (79.833 kg)  10/28/15 179 lb (81.194 kg)      Studies/Labs Reviewed:   EKG:  EKG - Sinus rhythm at a rate of 69. Normal axis. No ST changes.    Lipid Panel    Component Value Date/Time   CHOL 157 11/20/2013 0802   TRIG 100 11/20/2013 0802   HDL 36* 11/20/2013 0802   CHOLHDL 4.4 11/20/2013 0802   VLDL 20 11/20/2013 0802   LDLCALC 101* 11/20/2013 0802      A/P 1 Dyspnea-etiology of symptoms not clear. We will plan an  echocardiogram to assess LV function.  2 chest tightness-symptoms are atypical. Electrocardiogram is normal. He is very concerned about his symptoms given his father's history. Plan exercise treadmill for risk stratification.    Medication Adjustments/Labs and Tests Ordered: Current medicines are reviewed at length with the patient today.  Concerns regarding medicines are outlined above.  Medication changes, Labs and Tests ordered today are listed in the Patient Instructions below. There are no Patient Instructions on file for this visit.   Signed, Olga Millers, MD  04/01/2016 4:14 PM    [facility]

## 2016-03-30 ENCOUNTER — Ambulatory Visit (INDEPENDENT_AMBULATORY_CARE_PROVIDER_SITE_OTHER): Payer: Managed Care, Other (non HMO) | Admitting: Family Medicine

## 2016-03-30 VITALS — BP 99/67 | HR 72 | Temp 98.1°F

## 2016-03-30 DIAGNOSIS — Z23 Encounter for immunization: Secondary | ICD-10-CM

## 2016-03-30 NOTE — Progress Notes (Signed)
Patient came into clinic today with his wife. They have a newborn daughter and he would like to get the tdap vaccine. Pt tolerated injection in left deltoid well, no immediate complications.

## 2016-04-01 ENCOUNTER — Ambulatory Visit (INDEPENDENT_AMBULATORY_CARE_PROVIDER_SITE_OTHER): Payer: Managed Care, Other (non HMO) | Admitting: Cardiology

## 2016-04-01 ENCOUNTER — Encounter: Payer: Self-pay | Admitting: Cardiology

## 2016-04-01 VITALS — BP 96/68 | HR 69 | Ht 69.0 in | Wt 167.8 lb

## 2016-04-01 DIAGNOSIS — R072 Precordial pain: Secondary | ICD-10-CM | POA: Diagnosis not present

## 2016-04-01 DIAGNOSIS — R0600 Dyspnea, unspecified: Secondary | ICD-10-CM | POA: Diagnosis not present

## 2016-04-01 NOTE — Patient Instructions (Addendum)
Medication Instructions:   NO CHANGE  Testing/Procedures:  Your physician has requested that you have an exercise tolerance test. For further information please visit [email or web address]. Please also follow instruction sheet, as given.   Your physician has requested that you have an echocardiogram. Echocardiography is a painless test that uses sound waves to create images of your heart. It provides your doctor with information about the size and shape of your heart and how well your heart's chambers and valves are working. This procedure takes approximately one hour. There are no restrictions for this procedure.    Follow-Up:  Your physician recommends that you schedule a follow-up appointment in: AS NEEDED PENDING TEST RESULTS    Exercise Stress Electrocardiogram An exercise stress electrocardiogram is a test to check how blood flows to your heart. It is done to find areas of poor blood flow. You will need to walk on a treadmill for this test. The electrocardiogram will record your heartbeat when you are at rest and when you are exercising. BEFORE THE PROCEDURE  Do not have drinks with caffeine or foods with caffeine for 24 hours before the test, or as told by your doctor. This includes coffee, tea (even decaf tea), sodas, chocolate, and cocoa.  Follow your doctor's instructions about eating and drinking before the test.  Ask your doctor what medicines you should or should not take before the test. Take your medicines with water unless told by your doctor not to.  If you use an inhaler, bring it with you to the test.  Bring a snack to eat after the test.  Do not  smoke for 4 hours before the test.  Do not put lotions, powders, creams, or oils on your chest before the test.  Wear comfortable shoes and clothing. PROCEDURE  You will have patches put on your chest. Small areas of your chest may need to be shaved. Wires will be connected to the patches.  Your heart rate will be  watched while you are resting and while you are exercising.  You will walk on the treadmill. The treadmill will slowly get faster to raise your heart rate.  The test will take about 1-2 hours. AFTER THE PROCEDURE  Your heart rate and blood pressure will be watched after the test.  You may return to your normal diet, activities, and medicines or as told by your doctor.   This information is not intended to replace advice given to you by your health care provider. Make sure you discuss any questions you have with your health care provider.   Document Released: 02/17/2008 Document Revised: 09/21/2014 Document Reviewed: 05/08/2013 Elsevier Interactive Patient Education [employer]2016 Elsevier Inc.

## 2016-04-17 ENCOUNTER — Other Ambulatory Visit (HOSPITAL_COMMUNITY): Payer: Managed Care, Other (non HMO)

## 2016-04-20 ENCOUNTER — Ambulatory Visit (HOSPITAL_BASED_OUTPATIENT_CLINIC_OR_DEPARTMENT_OTHER): Payer: Managed Care, Other (non HMO) | Attending: Family Medicine | Admitting: Internal Medicine

## 2016-04-20 DIAGNOSIS — G4736 Sleep related hypoventilation in conditions classified elsewhere: Secondary | ICD-10-CM | POA: Insufficient documentation

## 2016-04-20 DIAGNOSIS — R0683 Snoring: Secondary | ICD-10-CM | POA: Insufficient documentation

## 2016-04-20 DIAGNOSIS — G4733 Obstructive sleep apnea (adult) (pediatric): Secondary | ICD-10-CM | POA: Diagnosis not present

## 2016-04-22 ENCOUNTER — Other Ambulatory Visit: Payer: Self-pay

## 2016-04-22 ENCOUNTER — Ambulatory Visit (HOSPITAL_COMMUNITY): Payer: Managed Care, Other (non HMO) | Attending: Cardiology

## 2016-04-22 ENCOUNTER — Ambulatory Visit (INDEPENDENT_AMBULATORY_CARE_PROVIDER_SITE_OTHER): Payer: Managed Care, Other (non HMO)

## 2016-04-22 DIAGNOSIS — Z8249 Family history of ischemic heart disease and other diseases of the circulatory system: Secondary | ICD-10-CM | POA: Diagnosis not present

## 2016-04-22 DIAGNOSIS — E785 Hyperlipidemia, unspecified: Secondary | ICD-10-CM | POA: Insufficient documentation

## 2016-04-22 DIAGNOSIS — I358 Other nonrheumatic aortic valve disorders: Secondary | ICD-10-CM | POA: Insufficient documentation

## 2016-04-22 DIAGNOSIS — I371 Nonrheumatic pulmonary valve insufficiency: Secondary | ICD-10-CM | POA: Insufficient documentation

## 2016-04-22 DIAGNOSIS — R0600 Dyspnea, unspecified: Secondary | ICD-10-CM

## 2016-04-22 DIAGNOSIS — I517 Cardiomegaly: Secondary | ICD-10-CM | POA: Diagnosis not present

## 2016-04-22 LAB — EXERCISE TOLERANCE TEST
CHL CUP MPHR: 175 {beats}/min
CHL RATE OF PERCEIVED EXERTION: 15
CSEPED: 12 min
CSEPEDS: 0 s
CSEPHR: 92 %
Estimated workload: 13.4 METS
Peak HR: 162 {beats}/min
Rest HR: 61 {beats}/min

## 2016-04-25 NOTE — Procedures (Signed)
   Patient Name: Jesse Brock, Jesse Brock Study Date: 04/20/2016 Gender: Male D.O.B: 1971-01-03 Age (years): 45 Referring Provider: Laren BoomSean Hommel Height (inches): 69 Interpreting Physician: Jetty Duhamellinton Kaydenn Mclear MD, ABSM Weight (lbs): 170 RPSGT: Jesse Brock, Jesse Brock BMI: 25 MRN: [id number] Neck Size: 15.50 CLINICAL INFORMATION Sleep Study Type: unattended HST   Indication for sleep study: Non-restorative sleep, OSA   Epworth Sleepiness Score: 4 SLEEP STUDY TECHNIQUE A multi-channel overnight portable sleep study was performed. The channels recorded were: nasal airflow, thoracic respiratory movement, and oxygen saturation with a pulse oximetry. Snoring was also monitored.  MEDICATIONS Patient self administered medications include: none reported during sleep study . SLEEP ARCHITECTURE Patient was studied for 474.8 minutes. The sleep efficiency was 97.5 % and the patient was supine for 50%. The arousal index was 0.0 per hour.  RESPIRATORY PARAMETERS The overall AHI was 12.4 per hour, with a central apnea index of 0.0 per hour. The oxygen nadir was 76% during sleep.  CARDIAC DATA Mean heart rate during sleep was 62.7 bpm.  IMPRESSIONS - Mild obstructive sleep apnea occurred during this study (AHI = 12.4/h). - No significant central sleep apnea occurred during this study (CAI = 0.0/h). - Oxygen desaturation was noted during this study (Min O2 = 76%). - Patient snored   DIAGNOSIS - Obstructive Sleep Apnea (327.23 [G47.33 ICD-10]) - Nocturnal Hypoxemia (327.26 [G47.36 ICD-10])  RECOMMENDATIONS - Therapeutic CPAP titration to determine optimal pressure required to alleviate sleep disordered breathing. - Positional therapy avoiding supine position during sleep. - Surgical consultation for Uvulopalatopharyngoplasty (UPPP) may be considered. - Oral appliance may be considered. - Avoid alcohol, sedatives and other CNS depressants that may worsen sleep apnea and disrupt normal sleep architecture. -  Sleep hygiene should be reviewed to assess factors that may improve sleep quality. - Weight management and regular exercise should be initiated or continued.  [Electronically signed] 04/25/2016 08:20 AM  Jetty Duhamellinton Iram Astorino MD, ABSM Diplomate, American Board of Sleep Medicine   NPI: [id number] Waymon BudgeYOUNG,Dalynn Jhaveri D Diplomate, American Board of Sleep Medicine  ELECTRONICALLY SIGNED ON:  04/25/2016, 8:18 AM [facility] PH: [phone]   FX: [phone] ACCREDITED BY THE AMERICAN ACADEMY OF SLEEP MEDICINE

## 2016-05-15 ENCOUNTER — Telehealth: Payer: Self-pay

## 2016-05-15 NOTE — Telephone Encounter (Signed)
Pt notified and stated that he needs a few days to think over his decision

## 2016-05-15 NOTE — Telephone Encounter (Signed)
I'm not sure why but this report was never forwarded to me.  That's why I never reached out to [place].  It confirms that he has mild sleep apnea and he has a few options.  We could try a CPAP machine that he'd have to wear on his face every night or another option would be to send him to an ENT specialist what would talk to him about removing some of his soft palate.  Either are reasonable option, it's up to [place].

## 2016-05-28 ENCOUNTER — Encounter: Payer: Self-pay | Admitting: Osteopathic Medicine

## 2016-05-28 ENCOUNTER — Ambulatory Visit (INDEPENDENT_AMBULATORY_CARE_PROVIDER_SITE_OTHER): Payer: Managed Care, Other (non HMO) | Admitting: Osteopathic Medicine

## 2016-05-28 VITALS — BP 106/70 | HR 61 | Ht 69.0 in | Wt 172.0 lb

## 2016-05-28 DIAGNOSIS — G4733 Obstructive sleep apnea (adult) (pediatric): Secondary | ICD-10-CM | POA: Diagnosis not present

## 2016-05-28 DIAGNOSIS — G4734 Idiopathic sleep related nonobstructive alveolar hypoventilation: Secondary | ICD-10-CM | POA: Diagnosis not present

## 2016-05-28 NOTE — Progress Notes (Signed)
HPI: Jesse Brock is a 45 y.o. male  who presents to [facility] today, 05/28/16,  for chief complaint of:  Chief Complaint  Patient presents with  . Establish Care    Questions about sleep apnea    Patient recently underwent sleep study and misdiagnosis sleep apnea. He has some questions about CPAP versus surgical options versus other treatments. Wife present.   Past medical, surgical, social and family history reviewed: Past Medical History:  Diagnosis Date  . Dyspnea   . Hyperlipidemia   . Sinus tarsi syndrome of left ankle   . Skin rash    Past Surgical History:  Procedure Laterality Date  . No prior surgery     Social History  Substance Use Topics  . Smoking status: Never Smoker  . Smokeless tobacco: Never Used  . Alcohol use 0.0 oz/week     Comment: Occasional   Family History  Problem Relation Age of Onset  . Lung cancer Father     smoked  . CAD Father     PCI in his late 4440s  . Stroke Brother      Current medication list and allergy/intolerance information reviewed:   No current outpatient prescriptions on file.   No current facility-administered medications for this visit.    No Known Allergies    Review of Systems:  Constitutional:+significant fatigue.   Cardiac: No  chest pain  Respiratory:  No  shortness of breath.  Exam:  BP 106/70   Pulse 61   Ht 5\' 9"  (1.753 m)   Wt 172 lb (78 kg)   BMI 25.40 kg/m   Constitutional: VS see above. General Appearance: alert, well-developed, well-nourished, NAD  Eyes: Normal lids and conjunctive, non-icteric sclera  Ears, Nose, Mouth, Throat: MMM, Normal external inspection ears/nares/mouth/lips/gums. Pharynx/tonsils no erythema, no exudate. Normal palate.Nasal mucosa normal. Mallampati Class I - II. R tonsil a bit bigger than L but doesn't appear obstructive.   Neck: No masses, trachea midline. No thyroid enlargement.   Respiratory: Normal respiratory effort.    Reviewed records from sleep study 04/20/2016: Diagnosis of obstructive sleep apnea and nocturnal hypoxemia. Recommendations were for therapeutic CPAP, positional therapy, surgical consultation to be considered, oral appliance to be considered, avoid alcohol, sedatives, other CNS depressants.  ASSESSMENT/PLAN:   Obstructive sleep apnea - Plan: Ambulatory referral to ENT  Nocturnal hypoxemia   Patient Instructions  Plan: Refer to ENT for second opinion and if they think you may benefit from surgery. If they would not recommend surgery, we can go ahead and order the equipment for the CPAP machine.    Visit summary with medication list and pertinent instructions was printed for patient to review. All questions at time of visit were answered - patient instructed to contact office with any additional concerns. ER/RTC precautions were reviewed with the patient. Follow-up plan: Return for Annual physical at your convenience.  Note: Total time spent 15 minutes, greater than 50% of the visit was spent face-to-face counseling and coordinating care for the following: The primary encounter diagnosis was Obstructive sleep apnea. A diagnosis of Nocturnal hypoxemia was also pertinent to this visit..Marland Kitchen

## 2016-05-28 NOTE — Patient Instructions (Addendum)
Plan: Refer to ENT for second opinion and if they think you may benefit from surgery. If they would not recommend surgery, we can go ahead and order the equipment for the CPAP machine.

## 2016-07-28 ENCOUNTER — Encounter: Payer: Self-pay | Admitting: Sports Medicine

## 2016-07-28 ENCOUNTER — Ambulatory Visit (INDEPENDENT_AMBULATORY_CARE_PROVIDER_SITE_OTHER): Payer: Managed Care, Other (non HMO)

## 2016-07-28 ENCOUNTER — Ambulatory Visit (INDEPENDENT_AMBULATORY_CARE_PROVIDER_SITE_OTHER): Payer: Managed Care, Other (non HMO) | Admitting: Sports Medicine

## 2016-07-28 DIAGNOSIS — J322 Chronic ethmoidal sinusitis: Secondary | ICD-10-CM

## 2016-07-28 DIAGNOSIS — S022XXA Fracture of nasal bones, initial encounter for closed fracture: Secondary | ICD-10-CM | POA: Diagnosis not present

## 2016-07-28 DIAGNOSIS — S0992XA Unspecified injury of nose, initial encounter: Secondary | ICD-10-CM

## 2016-07-28 DIAGNOSIS — Y9367 Activity, basketball: Secondary | ICD-10-CM

## 2016-07-28 DIAGNOSIS — H6123 Impacted cerumen, bilateral: Secondary | ICD-10-CM

## 2016-07-28 DIAGNOSIS — W500XXA Accidental hit or strike by another person, initial encounter: Secondary | ICD-10-CM | POA: Diagnosis not present

## 2016-07-28 MED ORDER — FLUTICASONE PROPIONATE 50 MCG/ACT NA SUSP: NASAL | 3 refills | Status: DC

## 2016-07-28 NOTE — Assessment & Plan Note (Signed)
CT of the facial bones. Intranasal fluticasone.

## 2016-07-28 NOTE — Progress Notes (Signed)
   Subjective:    I'm seeing this patient as a consultation for:  Dr. Laren BoomSean Hommel  CC: Nasal trauma  HPI: Yesterday this pleasant 45 year old male was playing basketball, elbow and in the face, he had immediate deformity of his nose, and pain over his maxillary, he tells me he reset the bone himself, he did get some dizziness.  Past medical history:  Negative.  See flowsheet/record as well for more information.  Surgical history: Negative.  See flowsheet/record as well for more information.  Family history: Negative.  See flowsheet/record as well for more information.  Social history: Negative.  See flowsheet/record as well for more information.  Allergies, and medications have been entered into the medical record, reviewed, and no changes needed.   Review of Systems: No headache, visual changes, nausea, vomiting, diarrhea, constipation, dizziness, abdominal pain, skin rash, fevers, chills, night sweats, weight loss, swollen lymph nodes, body aches, joint swelling, muscle aches, chest pain, shortness of breath, mood changes, visual or auditory hallucinations.   Objective:   General: Well Developed, well nourished, and in no acute distress.  Neuro/Psych: Alert and oriented x3, extra-ocular muscles intact, able to move all 4 extremities, sensation grossly intact.Cranial nerves 2 through 12 are intact, motor, sensory, coordinative functions are all intact. Skin: Warm and dry, no rashes noted.  Respiratory: Not using accessory muscles, speaking in full sentences, trachea midline.  Cardiovascular: Pulses palpable, no extremity edema. Abdomen: Does not appear distended. ENT: Nose is swollen, bruised, tenderness over the nasal bones as well as over the maxillary and zygomatic processes.  Impression and Recommendations:   This case required medical decision making of moderate complexity.  Nasal trauma CT of the facial bones. Intranasal fluticasone.

## 2016-07-29 NOTE — Assessment & Plan Note (Signed)
There are fractures of the left and right nasal bones, seem to be well aligned.  Ice it and protect from further injury, no basketball, return in 2 weeks to re evaluate.

## 2016-08-12 ENCOUNTER — Ambulatory Visit (INDEPENDENT_AMBULATORY_CARE_PROVIDER_SITE_OTHER): Payer: Managed Care, Other (non HMO) | Admitting: Sports Medicine

## 2016-08-12 ENCOUNTER — Encounter: Payer: Self-pay | Admitting: Sports Medicine

## 2016-08-12 DIAGNOSIS — S022XXD Fracture of nasal bones, subsequent encounter for fracture with routine healing: Secondary | ICD-10-CM

## 2016-08-12 NOTE — Progress Notes (Signed)
  Subjective:    CC: Follow-up  HPI: Jesse Brock is 2 weeks post nasal bone fracture, CT showed nondisplaced fractures of his left and right nasal bones. Overall he is feeling fairly good.  Past medical history:  Negative.  See flowsheet/record as well for more information.  Surgical history: Negative.  See flowsheet/record as well for more information.  Family history: Negative.  See flowsheet/record as well for more information.  Social history: Negative.  See flowsheet/record as well for more information.  Allergies, and medications have been entered into the medical record, reviewed, and no changes needed.   Review of Systems: No fevers, chills, night sweats, weight loss, chest pain, or shortness of breath.   Objective:    General: Well Developed, well nourished, and in no acute distress.  Neuro: Alert and oriented x3, extra-ocular muscles intact, sensation grossly intact.  HEENT: Normocephalic, atraumatic, pupils equal round reactive to light, neck supple, no masses, no lymphadenopathy, thyroid nonpalpable. Oropharynx, ear canals unremarkable, there is a slightly right-sided deviated septum, he does have a slightly larger nostril on the left side but tells me this is normal at baseline, I compared this to his driver's license picture, and it appears unchanged. Skin: Warm and dry, no rashes. Cardiac: Regular rate and rhythm, no murmurs rubs or gallops, no lower extremity edema.  Respiratory: Clear to auscultation bilaterally. Not using accessory muscles, speaking in full sentences.  Impression and Recommendations:    Fractured nasal bones Nondisplaced, left and right. Improving significantly, there is some baseline nasal asymmetry of the soft tissues. Return to see me as needed.

## 2016-08-12 NOTE — Assessment & Plan Note (Signed)
Nondisplaced, left and right. Improving significantly, there is some baseline nasal asymmetry of the soft tissues. Return to see me as needed.

## 2017-04-09 ENCOUNTER — Encounter: Payer: Self-pay | Admitting: Emergency Medicine

## 2017-04-09 ENCOUNTER — Emergency Department
Admission: EM | Admit: 2017-04-09 | Discharge: 2017-04-09 | Disposition: A | Discharge status: Home / Self Care | Payer: Managed Care, Other (non HMO) | Source: Home / Self Care | Attending: Family Medicine | Admitting: Family Medicine

## 2017-04-09 DIAGNOSIS — H01002 Unspecified blepharitis right lower eyelid: Secondary | ICD-10-CM

## 2017-04-09 MED ORDER — ERYTHROMYCIN 5 MG/GM OP OINT: TOPICAL_OINTMENT | OPHTHALMIC | 0 refills | Status: DC

## 2017-04-09 NOTE — ED Provider Notes (Signed)
CSN: [id number]     Arrival date & time 04/09/17  1349 History   First MD Initiated Contact with Patient 04/09/17 1407     Chief Complaint  Patient presents with  . Eye Problem   (Consider location/radiation/quality/duration/timing/severity/associated sxs/prior Treatment) HPI  Jesse Brock is a 46 y.o. male presenting to UC with c/o Right lower eyelid redness with swelling and Right eye irritation for about 5 days after mowing the lawn.  He has not tried anything for symptoms.  He does not believe he got any grass in the eye but states it feels like there might be something in his eye.  He typically wears glasses as his Right eye always has worse vision than the Left but he does not have his glasses today.  No other symptoms.    Past Medical History:  Diagnosis Date  . Dyspnea   . Hyperlipidemia   . Sinus tarsi syndrome of left ankle   . Skin rash    Past Surgical History:  Procedure Laterality Date  . No prior surgery     Family History  Problem Relation Age of Onset  . Lung cancer Father        smoked  . CAD Father        PCI in his late 4840s  . Stroke Brother    Social History  Substance Use Topics  . Smoking status: Never Smoker  . Smokeless tobacco: Never Used  . Alcohol use 0.0 oz/week     Comment: Occasional    Review of Systems  Constitutional: Negative for chills and fever.  HENT: Negative for congestion, ear pain and rhinorrhea.   Eyes: Positive for pain, redness and itching. Negative for photophobia, discharge and visual disturbance.  Respiratory: Negative for cough.   Neurological: Negative for dizziness, light-headedness and headaches.    Allergies  Patient has no known allergies.  Home Medications   Prior to Admission medications   Medication Sig Start Date End Date Taking? Authorizing Provider  erythromycin ophthalmic ointment Place a 1/2 inch ribbon of ointment into the lower eyelid 3 times daily for 5-7 days 04/09/17   Lurene ShadowPhelps, Makyi Ledo O, PA-C    fluticasone [employer](FLONASE) 50 MCG/ACT nasal spray One spray in each nostril twice a day, use left hand for right nostril, and right hand for left nostril. 07/28/16   Monica Bectonhekkekandam, Thomas J, MD   Meds Ordered and Administered this Visit  Medications - No data to display  BP 114/76 (BP Location: Right Arm)   Pulse 66   Temp 97.8 F (36.6 C) (Oral)   Wt 168 lb (76.2 kg)   SpO2 97%   BMI 24.81 kg/m  No data found.   Physical Exam  Constitutional: He is oriented to person, place, and time. He appears well-developed and well-nourished. No distress.  HENT:  Head: Normocephalic and atraumatic.  Mouth/Throat: Oropharynx is clear and moist.  Eyes: Pupils are equal, round, and reactive to light. EOM and lids are normal. Lids are everted and swept, no foreign bodies found. Right eye exhibits no discharge. No foreign body present in the right eye. Right conjunctiva is not injected.    Right lower eyelid: mild erythema and edema. Mildly tender. No discharge. Right eye: no fluorescein uptake   Neck: Normal range of motion.  Cardiovascular: Normal rate.   Pulmonary/Chest: Effort normal.  Musculoskeletal: Normal range of motion.  Neurological: He is alert and oriented to person, place, and time.  Skin: Skin is warm and dry. He is not  diaphoretic.  Psychiatric: He has a normal mood and affect. His behavior is normal.  Nursing note and vitals reviewed.   Urgent Care Course     Procedures (including critical care time)  Labs Review Labs Reviewed - No data to display  Imaging Review No results found.   Visual Acuity Review  Right Eye Distance: 20/70 Left Eye Distance: 20/30 Bilateral Distance:     MDM   1. Blepharitis of right lower eyelid, unspecified type    Hx and exam c/w blepharitis in Right lower eyelid.  Rx: erythromycin ointment Encouraged f/u with PCP in 4-5 days if not improving.    Lurene Shadowhelps, Irmalee Riemenschneider O, [place]PA-C 04/09/17 1624

## 2017-04-09 NOTE — ED Triage Notes (Signed)
Pt c/o right eye pain and swelling after mowing the yard x5 days ago. States pain has worsened.

## 2017-04-11 ENCOUNTER — Telehealth: Payer: Self-pay | Admitting: Emergency Medicine

## 2017-04-11 NOTE — Telephone Encounter (Signed)
Patient called states he feels about the same, antibiotics do seem to be working, however he feels like there something in his eye, advised to see eye specialist if no improvement in 2 days.

## 2017-10-12 ENCOUNTER — Encounter: Payer: Self-pay | Admitting: Osteopathic Medicine

## 2017-10-12 ENCOUNTER — Ambulatory Visit (INDEPENDENT_AMBULATORY_CARE_PROVIDER_SITE_OTHER): Payer: Managed Care, Other (non HMO) | Admitting: Osteopathic Medicine

## 2017-10-12 VITALS — BP 116/72 | HR 58 | Temp 97.9°F | Wt 165.0 lb

## 2017-10-12 DIAGNOSIS — J0100 Acute maxillary sinusitis, unspecified: Secondary | ICD-10-CM

## 2017-10-12 MED ORDER — AMOXICILLIN-POT CLAVULANATE 875-125 MG PO TABS: 1.0000 | ORAL_TABLET | Freq: Two times a day (BID) | ORAL | 0 refills | Status: DC

## 2017-10-12 NOTE — Progress Notes (Signed)
HPI: Jesse Brock is a 47 y.o. male who  has a past medical history of Dyspnea, Hyperlipidemia, Sinus tarsi syndrome of left ankle, and Skin rash.  [facility] today, 10/12/17,  for chief complaint of: Sinus concern   . Context: recent cold - whole family was sick, he's the only one not totally better . Location: L side face . Quality: pressure . Timing: constant . Modifying factors: Neti pot helps a bit  . Assoc signs/symptoms: no fever/cough. See ROS     Past medical, surgical, social and family history reviewed:  Patient Active Problem List   Diagnosis Date Noted  . Fractured nasal bones 07/28/2016  . Obstructive sleep apnea 05/28/2016  . Nocturnal hypoxemia 05/28/2016  . Sinus tarsi syndrome of left ankle 07/06/2014  . Dyspnea 05/10/2014  . SKIN RASH 02/16/2008    Past Surgical History:  Procedure Laterality Date  . No prior surgery      Social History   Tobacco Use  . Smoking status: Never Smoker  . Smokeless tobacco: Never Used  Substance Use Topics  . Alcohol use: Yes    Alcohol/week: 0.0 oz    Comment: Occasional    Family History  Problem Relation Age of Onset  . Lung cancer Father        smoked  . CAD Father        PCI in his late 6240s  . Stroke Brother      Current medication list and allergy/intolerance information reviewed:    Current Outpatient Medications  Medication Sig Dispense Refill  . erythromycin ophthalmic ointment Place a 1/2 inch ribbon of ointment into the lower eyelid 3 times daily for 5-7 days 3.5 g 0  . fluticasone (FLONASE) 50 MCG/ACT nasal spray One spray in each nostril twice a day, use left hand for right nostril, and right hand for left nostril. 48 g 3   No current facility-administered medications for this visit.     No Known Allergies    Review of Systems:  Constitutional:  No  fever, no chills, +recent illness, No unintentional weight changes. No significant  fatigue.   HEENT: No  headache, no vision change, no hearing change, No sore throat, +sinus pressure  Cardiac: No  chest pain, No  pressur  Respiratory:  No  shortness of breath. No  Cough  Gastrointestinal: No  abdominal pain, No  nausea  Musculoskeletal: No new myalgia/arthralgia  Skin: No  Rash  Neurologic: No  weakness, No  dizziness  Exam:  BP 116/72   Pulse (!) 58   Temp 97.9 F (36.6 C) (Oral)   Wt 165 lb (74.8 kg)   BMI 24.37 kg/m   Constitutional: VS see above. General Appearance: alert, well-developed, well-nourished, NAD  Eyes: Normal lids and conjunctive, non-icteric sclera  Ears, Nose, Mouth, Throat: MMM, Normal external inspection ears/nares/mouth/lips/gums. TM normal bilaterally. Pharynx/tonsils no erythema, no exudate. Nasal mucosa normal.   Neck: No masses, trachea midline. No thyroid enlargement. No tenderness/mass appreciated. No lymphadenopathy  Respiratory: Normal respiratory effort. no wheeze, no rhonchi, no rales  Cardiovascular: S1/S2 normal, no murmur, no rub/gallop auscultated. RRR.  Skin: warm, dry, intact     ASSESSMENT/PLAN:   Acute maxillary sinusitis, recurrence not specified   Meds ordered this encounter  Medications  . amoxicillin-clavulanate (AUGMENTIN) 875-125 MG tablet    Sig: Take 1 tablet by mouth 2 (two) times daily.    Dispense:  14 tablet    Refill:  0  Patient Instructions   Call if on better 5-7 days on antibiotics, call sooner if worse/change  Immunization History  Administered Date(s) Administered  . Influenza-Unspecified 06/14/2017  . Tdap 12/01/2011, 03/30/2016       Visit summary with medication list and pertinent instructions was printed for patient to review. All questions at time of visit were answered - patient instructed to contact office with any additional concerns. ER/RTC precautions were reviewed with the patient.   Follow-up plan: Return for annual physical when due, sooner if  needed.    Please note: voice recognition software was used to produce this document, and typos may escape review. Please contact Dr. Lyn Hollingshead for any needed clarifications.

## 2017-10-12 NOTE — Patient Instructions (Signed)
Call if on better 5-7 days on antibiotics, call sooner if worse/change  Immunization History  Administered Date(s) Administered  . Influenza-Unspecified 06/14/2017  . Tdap 12/01/2011, 03/30/2016

## 2018-01-04 ENCOUNTER — Encounter: Payer: Self-pay | Admitting: Osteopathic Medicine

## 2018-01-04 ENCOUNTER — Ambulatory Visit: Payer: Managed Care, Other (non HMO) | Admitting: Osteopathic Medicine

## 2018-01-04 VITALS — BP 118/73 | HR 72 | Temp 98.4°F | Wt 169.2 lb

## 2018-01-04 DIAGNOSIS — H01001 Unspecified blepharitis right upper eyelid: Secondary | ICD-10-CM | POA: Diagnosis not present

## 2018-01-04 MED ORDER — ERYTHROMYCIN 5 MG/GM OP OINT: TOPICAL_OINTMENT | OPHTHALMIC | 0 refills | Status: DC

## 2018-01-04 NOTE — Progress Notes (Addendum)
HPI: Jesse Brock is a 47 y.o. male who  has a past medical history of Dyspnea, Hyperlipidemia, Sinus tarsi syndrome of left ankle, and Skin rash.  [facility] today, 01/04/18,  for chief complaint of: Eye problem  Concern for itchy, watery eye on R side. Ongoing x1 week. Feeling eye scratchiness in AM.   Records reviewed: [facility]. He states this episode is similar.   Scratchy throat for a few days. No hx allergies.    Past medical, surgical, social and family history reviewed:  Patient Active Problem List   Diagnosis Date Noted  . Fractured nasal bones 07/28/2016  . Obstructive sleep apnea 05/28/2016  . Nocturnal hypoxemia 05/28/2016  . Sinus tarsi syndrome of left ankle 07/06/2014  . Dyspnea 05/10/2014  . SKIN RASH 02/16/2008    Past Surgical History:  Procedure Laterality Date  . No prior surgery      Social History   Tobacco Use  . Smoking status: Never Smoker  . Smokeless tobacco: Never Used  Substance Use Topics  . Alcohol use: Yes    Alcohol/week: 0.0 oz    Comment: Occasional    Family History  Problem Relation Age of Onset  . Lung cancer Father        smoked  . CAD Father        PCI in his late 1140s  . Stroke Brother      Current medication list and allergy/intolerance information reviewed:    Current Outpatient Medications  Medication Sig Dispense Refill  . amoxicillin-clavulanate (AUGMENTIN) 875-125 MG tablet Take 1 tablet by mouth 2 (two) times daily. 14 tablet 0  . erythromycin ophthalmic ointment Place a 1/2 inch ribbon of ointment into the lower eyelid 3 times daily for 5-7 days 3.5 g 0  . fluticasone (FLONASE) 50 MCG/ACT nasal spray One spray in each nostril twice a day, use left hand for right nostril, and right hand for left nostril. 48 g 3   No current facility-administered medications for this visit.     No Known Allergies     Review of Systems:  Constitutional:  No  fever, no chills, No recent illness,  HEENT: No  headache, no vision change, no hearing change, +scracthy but not painful throat, No  sinus pressure  Cardiac: No  chest pain, No  pressure, No palpitations  Respiratory:  No  shortness of breath. No  Cough  Skin: No  Rash except eyelid  Neurologic: No  weakness, No  dizziness  Exam:  BP 118/73 (BP Location: Left Arm, Patient Position: Sitting, Cuff Size: Normal)   Pulse 72   Temp 98.4 F (36.9 C) (Oral)   Wt 169 lb 3.2 oz (76.7 kg)   BMI 24.99 kg/m   Constitutional: VS see above. General Appearance: alert, well-developed, well-nourished, NAD  Eyes: Normal conjunctive, non-icteric sclera. (+)erythematous upper eyelid on R. EOMI, PERRL.   Ears, Nose, Mouth, Throat: MMM, Normal external inspection ears/nares/mouth/lips/gums. TM normal bilaterally. Pharynx/tonsils no erythema, no exudate. Nasal mucosa normal.   Neck: No masses, trachea midline. No tenderness/mass appreciated. No lymphadenopathy  Respiratory: Normal respiratory effort.   Musculoskeletal: Gait normal.   Neurological: Normal balance/coordination. No tremor.   Psychiatric: Normal judgment/insight. Normal mood and affect.    ASSESSMENT/PLAN:   Blepharitis of right upper eyelid, unspecified type   Meds ordered this encounter  Medications  . erythromycin ophthalmic ointment    Sig: Place a  1/2 inch ribbon of ointment into the lower eyelid of R eye 3 times daily for 5-7 days    Dispense:  3.5 g    Refill:  0    Patient Instructions  In active infection, clean lid margins twice daily:  Apply warm compress for 2 minutes, this loosens debris  Gently scrub lashes and lid margins with warm cloth, can also use dilute baby shampoo  Wipe lid margins with warm cloth after scrubbing   Advised OTC allergy tc steroid NS +/- 2nd Gen antihistamines, see me if no better.    Visit summary with medication list and  pertinent instructions was printed for patient to review. All questions at time of visit were answered - patient instructed to contact office with any additional concerns. ER/RTC precautions were reviewed with the patient.   Follow-up plan: Return if symptoms worsen or fail to improve.    Please note: voice recognition software was used to produce this document, and typos may escape review. Please contact Dr. Lyn Hollingshead for any needed clarifications.

## 2018-01-04 NOTE — Patient Instructions (Signed)
In active infection, clean lid margins twice daily:  Apply warm compress for 2 minutes, this loosens debris  Gently scrub lashes and lid margins with warm cloth, can also use dilute baby shampoo  Wipe lid margins with warm cloth after scrubbing

## 2018-03-01 ENCOUNTER — Telehealth: Payer: Self-pay | Admitting: Osteopathic Medicine

## 2018-03-01 NOTE — Telephone Encounter (Signed)
Forwarding to provider for review.

## 2018-03-01 NOTE — Telephone Encounter (Signed)
Pt called. He wants to be referred for sleep apnea. In the past Dr Ivan AnchorsHommel had referred him but he never attended the appointment.

## 2018-03-02 NOTE — Telephone Encounter (Signed)
Pt had a sleep study completed at [facility] 1 1/2 year ago, however he never follow up with after care. Pt requested to make an appt w/provider to discuss sleep apnea dx in details. Transferred to FD. Meanwhile, pt will contact his insurance regarding sleep apnea care/coverage of products.

## 2018-03-02 NOTE — Telephone Encounter (Signed)
Please clarify w/ patient: is he requesting another sleep study? He had one 04/2016. I sent referral to [facility] awhile back in 05/2016 when he had questions about surgery vs CPAP, does he need me to renew that referral? If no, that's fine. If he's wanting another sleep study or if there are new symptoms, insurance will probably want him to come see me for a visit to discuss before we order another test

## 2018-03-02 NOTE — Telephone Encounter (Signed)
Love it! Thanks.

## 2018-03-07 ENCOUNTER — Ambulatory Visit (INDEPENDENT_AMBULATORY_CARE_PROVIDER_SITE_OTHER): Payer: Managed Care, Other (non HMO) | Admitting: Osteopathic Medicine

## 2018-03-07 VITALS — BP 109/66 | HR 60 | Temp 98.4°F | Wt 171.5 lb

## 2018-03-07 DIAGNOSIS — G4734 Idiopathic sleep related nonobstructive alveolar hypoventilation: Secondary | ICD-10-CM

## 2018-03-07 DIAGNOSIS — B354 Tinea corporis: Secondary | ICD-10-CM | POA: Diagnosis not present

## 2018-03-07 DIAGNOSIS — G4733 Obstructive sleep apnea (adult) (pediatric): Secondary | ICD-10-CM

## 2018-03-07 MED ORDER — FLUCONAZOLE 150 MG PO TABS: 150.0000 mg | ORAL_TABLET | ORAL | 0 refills | Status: DC

## 2018-03-07 MED ORDER — AMBULATORY NON FORMULARY MEDICATION: 1 refills | Status: AC

## 2018-03-07 NOTE — Patient Instructions (Signed)
Sleep Apnea  Rx written and sent to home health medical supply  If any insurance barriers arise, you should get a call  If you don't get contacted about the CPAP in 7-10 days, please let us know  Copy of study provided for your record-keeping   Rash  Will try oral antifungal for rash, can also continue OTC Lotrimin topical treatment and continue it on the affected area for 5+ days after rash totally resolved

## 2018-03-07 NOTE — Telephone Encounter (Signed)
Thanks Vanicia. °

## 2018-03-07 NOTE — Progress Notes (Signed)
HPI: Jesse Brock is a 47 y.o. male who  has a past medical history of Dyspnea, Hyperlipidemia, Sinus tarsi syndrome of left ankle, and Skin rash.  he presents to [facility] today, 03/07/18,  for chief complaint of:  Follow up sleep study  "Pt had a sleep study completed at [place] 1 1/2 year ago, however he never follow up with after care. Pt requested to make an appt w/provider to discuss sleep apnea dx in details. Transferred to [place]. Meanwhile, pt will contact his insurance regarding sleep apnea care/coverage of products"  Sleep study reviewed, moderate criteria met, he would like to try CPAP and see if this helps before pursuing possible surgery. Has a young baby at home, wife is due again in the next few months, he'd understandably like to be sleeping as well as he can - still having a lot of nighttime awakenings feeling like choking, as well as daytime somnolence.   Also concerned about fungal rash on arm, OTC lotrimin helps but it seems to come back.      Past medical history, surgical history, and family history reviewed.  Current medication list and allergy/intolerance information reviewed.   (See remainder of HPI, ROS, Phys Exam below)    ASSESSMENT/PLAN:   Nocturnal hypoxemia  Moderate obstructive sleep apnea  Tinea corporis     Meds ordered this encounter  Medications  . AMBULATORY NON FORMULARY MEDICATION    Sig: Continuous positive airway pressure (CPAP) device: Auto titrate to minimum of 5 and maximum of 20 cm H2O with pressure. Please provide all supplemental supplies as needed. (Patient prefers device where he can monitor O2 levels overnight, if such a device exists, otherwise disc pense per insurance coverage)    Dispense:  1 Units    Refill:  1  . fluconazole (DIFLUCAN) 150 MG tablet    Sig: Take 1 tablet (150 mg total) by mouth once a week. For 2 - 4 weeks    Dispense:  4 tablet    Refill:  0    Patient  Instructions  Sleep Apnea  Rx written and sent to home health medical supply  If any insurance barriers arise, you should get a call  If you don't get contacted about the CPAP in 7-10 days, please let us know  Copy of study provided for your record-keeping   Rash  Will try oral antifungal for rash, can also continue OTC Lotrimin topical treatment and continue it on the affected area for 5+ days after rash totally resolved    Follow-up plan: Return for annual preventive care physical and recheck sleeping, 3-6 months .     ############################################ ############################################ ############################################ ############################################    Outpatient Encounter Medications as of 03/07/2018  Medication Sig Note  . amoxicillin-clavulanate (AUGMENTIN) 875-125 MG tablet Take 1 tablet by mouth 2 (two) times daily. (Patient not taking: Reported on 01/04/2018)   . erythromycin ophthalmic ointment Place a 1/2 inch ribbon of ointment into the lower eyelid of R eye 3 times daily for 5-7 days (Patient not taking: Reported on 03/07/2018)   . fluticasone (FLONASE) 50 MCG/ACT nasal spray One spray in each nostril twice a day, use left hand for right nostril, and right hand for left nostril. (Patient not taking: Reported on 03/07/2018) 10/12/2017: PRN   No facility-administered encounter medications on file as of 03/07/2018.    No Known Allergies    Review of Systems:  Constitutional: No recent illness, +fatigue.   HEENT: No  headache, no vision change  Cardiac: No  chest pain, No  pressure  Respiratory:  No  shortness of breath. No  Cough  Gastrointestinal: No  abdominal pain, no change on bowel habits  Musculoskeletal: No new myalgia/arthralgia  Skin: +Rash  Hem/Onc: No  easy bruising/bleeding, No  abnormal lumps/bumps  Neurologic: No  weakness, No  Dizziness  Psychiatric: No  concerns with depression, No  concerns  with anxiety  Exam:  BP 109/66 (BP Location: Left Arm, Patient Position: Sitting, Cuff Size: Normal)   Pulse 60   Temp 98.4 F (36.9 C) (Oral)   Wt 171 lb 8 oz (77.8 kg)   BMI 25.33 kg/m   Constitutional: VS see above. General Appearance: alert, well-developed, well-nourished, NAD  Eyes: Normal lids and conjunctive, non-icteric sclera  Ears, Nose, Mouth, Throat: MMM, Normal external inspection ears/nares/mouth/lips/gums.  Neck: No masses, trachea midline.   Respiratory: Normal respiratory effort. no wheeze, no rhonchi, no rales  Cardiovascular: S1/S2 normal, no murmur, no rub/gallop auscultated. RRR.   Musculoskeletal: Gait normal. Symmetric and independent movement of all extremities  Neurological: Normal balance/coordination. No tremor.  Skin: warm, dry, intact. Mild rash on R arm near antecubital fossa, slight erythema/scales c/w tinea  Psychiatric: Normal judgment/insight. Normal mood and affect. Oriented x3.   Visit summary with medication list and pertinent instructions was printed for patient to review, advised to alert [place] if any changes needed. All questions at time of visit were answered - patient instructed to contact office with any additional concerns. ER/RTC precautions were reviewed with the patient and understanding verbalized.   Follow-up plan: Return for annual preventive care physical and recheck sleeping, 3-6 months .  Note: Total time spent 25 minutes, greater than 50% of the visit was spent face-to-face counseling and coordinating care for the following: The primary encounter diagnosis was Nocturnal hypoxemia. A diagnosis of Moderate obstructive sleep apnea was also pertinent to this visit.Marland Kitchen  Please note: voice recognition software was used to produce this document, and typos may escape review. Please contact Dr. Sheppard Coil for any needed clarifications.

## 2018-03-08 ENCOUNTER — Encounter: Payer: Self-pay | Admitting: Osteopathic Medicine

## 2018-04-29 ENCOUNTER — Encounter: Payer: Self-pay | Admitting: Osteopathic Medicine

## 2018-04-29 ENCOUNTER — Ambulatory Visit (INDEPENDENT_AMBULATORY_CARE_PROVIDER_SITE_OTHER): Payer: Managed Care, Other (non HMO) | Admitting: Osteopathic Medicine

## 2018-04-29 VITALS — BP 112/63 | HR 70 | Temp 98.1°F | Wt 167.7 lb

## 2018-04-29 DIAGNOSIS — Z0000 Encounter for general adult medical examination without abnormal findings: Secondary | ICD-10-CM | POA: Diagnosis not present

## 2018-04-29 DIAGNOSIS — J019 Acute sinusitis, unspecified: Secondary | ICD-10-CM

## 2018-04-29 DIAGNOSIS — G4733 Obstructive sleep apnea (adult) (pediatric): Secondary | ICD-10-CM

## 2018-04-29 MED ORDER — AMOXICILLIN-POT CLAVULANATE 875-125 MG PO TABS: 1.0000 | ORAL_TABLET | Freq: Two times a day (BID) | ORAL | 0 refills | Status: AC

## 2018-04-29 NOTE — Patient Instructions (Addendum)
General Preventive Care  Most recent routine screening lipids/other labs: ordered today  Tobacco: don't! Alcohol: moderation is ok for most people. Recreational/Illicit Drugs: don't!  Exercise: as tolerated to reduce risk of cardiovascular disease and diabetes  Mental health: if need for mental health care (medicines, counseling, other), or concerns about moods, please let me know!   Sexual health: if need for STD testing, or if concerns for libido/pain, please let me know!   Vaccines  Flu vaccine: recommended every fall (by Halloween!)  Shingles vaccine: Shingrix recommended after age 47  Pneumonia vaccines: Prevnar and Pneumovax recommended after age 47  Tetanus booster: Tdap recommended every 10 years.  Your last vaccine was July 2017.  Cancer screenings   Colon cancer screening: recommended at age 150  Prostate cancer screening: recommendations vary, optional PSA blood test for men around age 47  Infection screenings . HIV: recommended screening at least once age 47-65, more often if risk factors  . Gonorrhea/Chlamydia: screening as needed . Hepatitis C: recommended for anyone born 1945-1965, not required for you  Other . Aspirin: not necessary for you . Bone Density Test: recommended for men at age 47 . Advanced Directive: Living Will and/or Healthcare Power of Attorney recommended for everyone, regardless of age or health . Cholesterol: recommended screening annually . Diabetes: recommended screening annually  . Thyroid and Vitamin D: routine screening not medically necessary, most insurance will not cover this testing in the absence of concerning symptoms or other relevant medical problems.

## 2018-04-29 NOTE — Progress Notes (Signed)
HPI: Jesse Brock is a 47 y.o. male who  has a past medical history of Dyspnea, Hyperlipidemia, Sinus tarsi syndrome of left ankle, and Skin rash.  he [facility] today, 04/29/18,  for chief complaint of: Annual CPAP follow-up, feels like he might have a sinus infection   Was on schedule for sleep followup, should have been annual. Can do this today.  Using CPAP consistently and noticing benefit in clep and no longer snoring.   Used tap water in the CPAP about a week ago, since then has noticed some increased sinus pressure, sore throat.  See below for review of preventive care.     Past medical, surgical, social and family history reviewed:  Patient Active Problem List   Diagnosis Date Noted  . Blepharitis of right upper eyelid 01/04/2018  . Fractured nasal bones 07/28/2016  . Obstructive sleep apnea 05/28/2016  . Nocturnal hypoxemia 05/28/2016  . Sinus tarsi syndrome of left ankle 07/06/2014  . Dyspnea 05/10/2014  . SKIN RASH 02/16/2008    Past Surgical History:  Procedure Laterality Date  . No prior surgery      Social History   Tobacco Use  . Smoking status: Never Smoker  . Smokeless tobacco: Never Used  Substance Use Topics  . Alcohol use: Yes    Alcohol/week: 0.0 standard drinks    Comment: Occasional    Family History  Problem Relation Age of Onset  . Lung cancer Father        smoked  . CAD Father        PCI in his late 3440s  . Stroke Brother      Current medication list and allergy/intolerance information reviewed:    Current Outpatient Medications  Medication Sig Dispense Refill  . AMBULATORY NON FORMULARY MEDICATION Continuous positive airway pressure (CPAP) device: Auto titrate to minimum of 5 and maximum of 20 cm H2O with pressure. Please provide all supplemental supplies as needed. (Patient prefers device where he can monitor O2 levels overnight, if such a device exists, otherwise disc pense per  insurance coverage) 1 Units 1  . fluconazole (DIFLUCAN) 150 MG tablet Take 1 tablet (150 mg total) by mouth once a week. For 2 - 4 weeks (Patient not taking: Reported on 04/29/2018) 4 tablet 0   No current facility-administered medications for this visit.     No Known Allergies    Review of Systems:  Constitutional:  No  fever, no chills, No recent illness, No unintentional weight changes. No significant fatigue.   HEENT: No  headache, no vision change, no hearing change, No sore throat, No  sinus pressure  Cardiac: No  chest pain, No  pressure, No palpitations, No  Orthopnea  Respiratory:  No  shortness of breath. No  Cough  Gastrointestinal: No  abdominal pain, No  nausea, No  vomiting,  No  blood in stool, No  diarrhea, No  constipation   Musculoskeletal: No new myalgia/arthralgia  Skin: No  Rash, No other wounds/concerning lesions  Genitourinary: No  incontinence, No  abnormal genital bleeding, No abnormal genital discharge  Hem/Onc: No  easy bruising/bleeding, No  abnormal lymph node  Endocrine: No cold intolerance,  No heat intolerance. No polyuria/polydipsia/polyphagia   Neurologic: No  weakness, No  dizziness, No  slurred speech/focal weakness/facial droop  Psychiatric: No  concerns with depression, No  concerns with anxiety, No sleep problems, No mood problems  Exam:  BP 112/63 (BP Location: Left Arm, Patient Position: Sitting,  Cuff Size: Normal)   Pulse 70   Temp 98.1 F (36.7 C) (Oral)   Wt 167 lb 11.2 oz (76.1 kg)   BMI 24.76 kg/m   Constitutional: VS see above. General Appearance: alert, well-developed, well-nourished, NAD  Eyes: Normal lids and conjunctive, non-icteric sclera  Ears, Nose, Mouth, Throat: MMM, Normal external inspection ears/nares/mouth/lips/gums. TM normal bilaterally. Pharynx/tonsils no erythema, no exudate. Nasal mucosa normal.   Neck: No masses, trachea midline. No thyroid enlargement. No tenderness/mass appreciated. No  lymphadenopathy  Respiratory: Normal respiratory effort. no wheeze, no rhonchi, no rales  Cardiovascular: S1/S2 normal, no murmur, no rub/gallop auscultated. RRR. No lower extremity edema. Pedal pulse II/IV bilaterally DP and PT. No carotid bruit or JVD. No abdominal aortic bruit.  Gastrointestinal: Nontender, no masses. No hepatomegaly, no splenomegaly. No hernia appreciated. Bowel sounds normal. Rectal exam deferred.   Musculoskeletal: Gait normal. No clubbing/cyanosis of digits.   Neurological: Normal balance/coordination. No tremor. No cranial nerve deficit on limited exam. Motor and sensation intact and symmetric. Cerebellar reflexes intact.   Skin: warm, dry, intact. No rash/ulcer. No concerning nevi or subq nodules on limited exam.    Psychiatric: Normal judgment/insight. Normal mood and affect. Oriented x3.       ASSESSMENT/PLAN:   Annual physical exam - Plan: CBC, COMPLETE METABOLIC PANEL WITH GFR, Lipid panel  Moderate obstructive sleep apnea - Plan: CBC, COMPLETE METABOLIC PANEL WITH GFR, Lipid panel  Acute sinusitis, recurrence not specified, unspecified location - Plan: amoxicillin-clavulanate (AUGMENTIN) 875-125 MG tablet    Patient Instructions  General Preventive Care  Most recent routine screening lipids/other labs: ordered today  Tobacco: don't! Alcohol: moderation is ok for most people. Recreational/Illicit Drugs: don't!  Exercise: as tolerated to reduce risk of cardiovascular disease and diabetes  Mental health: if need for mental health care (medicines, counseling, other), or concerns about moods, please let me know!   Sexual health: if need for STD testing, or if concerns for libido/pain, please let me know!   Vaccines  Flu vaccine: recommended every fall (by Halloween!)  Shingles vaccine: Shingrix recommended after age 58  Pneumonia vaccines: Prevnar and Pneumovax recommended after age 79  Tetanus booster: Tdap recommended every 10 years.  Your  last vaccine was July 2017.  Cancer screenings   Colon cancer screening: recommended at age 8  Prostate cancer screening: recommendations vary, optional PSA blood test for men around age 37  Infection screenings . HIV: recommended screening at least once age 2-65, more often if risk factors  . Gonorrhea/Chlamydia: screening as needed . Hepatitis C: recommended for anyone born 1945-1965, not required for you  Other . Aspirin: not necessary for you . Bone Density Test: recommended for men at age 80 . Advanced Directive: Living Will and/or Healthcare Power of Attorney recommended for everyone, regardless of age or health . Cholesterol: recommended screening annually . Diabetes: recommended screening annually  . Thyroid and Vitamin D: routine screening not medically necessary, most insurance will not cover this testing in the absence of concerning symptoms or other relevant medical problems.           Visit summary with medication list and pertinent instructions was printed for patient to review. All questions at time of visit were answered - patient instructed to contact office with any additional concerns. ER/RTC precautions were reviewed with the patient.   Follow-up plan: Return in about 1 year (around 04/30/2019) for annual physical, sooner if needed .    Please note: voice recognition software was used to produce  this document, and typos may escape review. Please contact Dr. Lyn HollingsheadAlexander for any needed clarifications.

## 2018-04-30 LAB — CBC
HCT: 42.9 % (ref 38.5–50.0)
Hemoglobin: 14.6 g/dL (ref 13.2–17.1)
MCH: 31.3 pg (ref 27.0–33.0)
MCHC: 34 g/dL (ref 32.0–36.0)
MCV: 92.1 fL (ref 80.0–100.0)
MPV: 10.2 fL (ref 7.5–12.5)
PLATELETS: 170 10*3/uL (ref 140–400)
RBC: 4.66 10*6/uL (ref 4.20–5.80)
RDW: 12.2 % (ref 11.0–15.0)
WBC: 6.4 10*3/uL (ref 3.8–10.8)

## 2018-04-30 LAB — COMPLETE METABOLIC PANEL WITH GFR
AG RATIO: 1.6 (calc) (ref 1.0–2.5)
ALT: 20 U/L (ref 9–46)
AST: 20 U/L (ref 10–40)
Albumin: 4.1 g/dL (ref 3.6–5.1)
Alkaline phosphatase (APISO): 74 U/L (ref 40–115)
BILIRUBIN TOTAL: 0.4 mg/dL (ref 0.2–1.2)
BUN: 15 mg/dL (ref 7–25)
CHLORIDE: 103 mmol/L (ref 98–110)
CO2: 31 mmol/L (ref 20–32)
Calcium: 8.9 mg/dL (ref 8.6–10.3)
Creat: 1.08 mg/dL (ref 0.60–1.35)
GFR, EST AFRICAN AMERICAN: 94 mL/min/{1.73_m2} (ref >=60)
GFR, EST NON AFRICAN AMERICAN: 81 mL/min/{1.73_m2} (ref >=60)
GLOBULIN: 2.6 g/dL (ref 1.9–3.7)
Glucose, Bld: 83 mg/dL (ref 65–139)
Potassium: 5 mmol/L (ref 3.5–5.3)
SODIUM: 139 mmol/L (ref 135–146)
TOTAL PROTEIN: 6.7 g/dL (ref 6.1–8.1)

## 2018-04-30 LAB — LIPID PANEL
Cholesterol: 148 mg/dL (ref <200)
HDL: 40 mg/dL — ABNORMAL LOW (ref >40)
LDL Cholesterol (Calc): 93 mg/dL (calc)
NON-HDL CHOLESTEROL (CALC): 108 mg/dL (ref <130)
Total CHOL/HDL Ratio: 3.7 (calc) (ref <5.0)
Triglycerides: 68 mg/dL (ref <150)

## 2018-05-09 ENCOUNTER — Encounter: Payer: Self-pay | Admitting: Osteopathic Medicine

## 2019-05-01 ENCOUNTER — Encounter: Payer: Managed Care, Other (non HMO) | Admitting: Osteopathic Medicine

## 2019-11-14 ENCOUNTER — Encounter: Payer: Self-pay | Admitting: Physician Assistant

## 2019-11-14 ENCOUNTER — Other Ambulatory Visit: Payer: Self-pay

## 2019-11-14 ENCOUNTER — Ambulatory Visit (INDEPENDENT_AMBULATORY_CARE_PROVIDER_SITE_OTHER): Payer: Managed Care, Other (non HMO)

## 2019-11-14 ENCOUNTER — Ambulatory Visit (INDEPENDENT_AMBULATORY_CARE_PROVIDER_SITE_OTHER): Payer: Managed Care, Other (non HMO) | Admitting: Physician Assistant

## 2019-11-14 VITALS — BP 105/68 | HR 63 | Ht 69.0 in | Wt 165.0 lb

## 2019-11-14 DIAGNOSIS — B070 Plantar wart: Secondary | ICD-10-CM | POA: Diagnosis not present

## 2019-11-14 DIAGNOSIS — M79672 Pain in left foot: Secondary | ICD-10-CM

## 2019-11-14 NOTE — Patient Instructions (Signed)
Plantar Warts Warts are small growths on the skin. When they occur on the underside (sole) of the foot, they are called plantar warts. Plantar warts often occur in groups, with several small warts around a larger wart. They tend to develop on the heel or the ball of the foot. They may grow into the deeper layers of skin or rise above the surface of the skin. Most warts are not painful, and they usually do not cause problems. However, plantar warts may cause pain when you walk because pressure is applied to them. Plantar warts may spread to other areas of the sole. They can also spread to other areas of the body through direct and indirect contact. Warts often go away on their own in time. Various treatments may be done if needed or desired. What are the causes? Plantar warts are caused by a type of virus that is called human papillomavirus (HPV).  Walking barefoot can cause exposure to the virus, especially if your feet are wet.  HPV attacks a break in the skin of the foot. What increases the risk? You are more likely to develop this condition if you:  Are between 10-20 years of age.  Use public showers or locker rooms.  Have a weakened body defense system (immune system). What are the signs or symptoms? Common symptoms of this condition include:  Flat or slightly raised growths that have a rough surface and look similar to a callus.  Pain when you use your foot to support your body weight. How is this diagnosed? A plantar wart can usually be diagnosed from its appearance. In some cases, a tissue sample may be removed (biopsy) to be looked at under a microscope. How is this treated? In many cases, warts do not need treatment. Without treatment, they often go away with time. If treatment is needed or desired, options may include:  Applying medicated solutions, creams, or patches to the wart. These may be over-the-counter or prescription medicines that make the skin soft so that layers will  gradually shed away. In many cases, the medicine is applied one or two times a day and covered with a bandage.  Freezing the wart with liquid nitrogen (cryotherapy).  Burning the wart with: ? Laser treatment. ? An electrified probe (electrocautery).  Injecting a medicine (Candida antigen) into the wart to help the body's immune system fight off the wart.  Having surgery to remove the wart.  Putting duct tape over the top of the wart (occlusion). You will leave the tape in place for as long as told by your health care provider, and then you will replace it with a new strip of tape. This is done until the wart goes away. Repeat treatment may be needed if you choose to remove warts. Warts sometimes go away and come back again. Follow these instructions at home:  Apply medicated creams or solutions only as told by your health care provider. This may involve: ? Soaking the affected area in warm water. ? Removing the top layer of softened skin before you apply the medicine. A pumice stone works well for removing the skin. ? Applying a bandage over the affected area after you apply the medicine. ? Repeating the process daily or as told by your health care provider.  Do not scratch or pick at a wart.  Wash your hands after you touch a wart.  If a wart is painful, try covering it with a bandage that has a hole in the middle. This helps   to take pressure off the wart.  Keep all follow-up visits as told by your health care provider. This is important. How is this prevented? Take these actions to help prevent warts:  Wear shoes and socks. Change your socks daily.  Keep your feet clean and dry.  Do not walk barefoot in shared locker rooms, shower areas, or swimming pools.  Check your feet regularly.  Avoid direct contact with warts on other people. Contact a health care provider if:  Your warts do not improve after treatment.  You have redness, swelling, or pain at the site of a  wart.  You have bleeding from a wart that does not stop with light pressure.  You have diabetes and you develop a wart. Summary  Warts are small growths on the skin. When they occur on the underside (sole) of the foot, they are called plantar warts.  In many cases, warts do not need treatment. Without treatment, they often go away with time.  Apply medicated creams or solutions only as told by your health care provider.  Do not scratch or pick at a wart. Wash your hands after you touch a wart.  Keep all follow-up visits as told by your health care provider. This is important. This information is not intended to replace advice given to you by your health care provider. Make sure you discuss any questions you have with your health care provider. Document Revised: 03/29/2018 Document Reviewed: 03/29/2018 Elsevier Patient Education  2020 [employer]  

## 2019-11-14 NOTE — Progress Notes (Signed)
   Subjective:    Patient ID: Jesse Brock, male    DOB: [birth year], 49 y.o.   MRN: [id number]  HPI  Pt is a 49 yo male who presents to the clinic with left lateral foot pain and "hard place" for over a year. He does not remember stepping on anything but wonders if he could have stepped on something and be stuck in his foot. It does create some discomfort to walk on. Not tried anything to make better.   .. Active Ambulatory Problems    Diagnosis Date Noted  . SKIN RASH 02/16/2008  . Dyspnea 05/10/2014  . Sinus tarsi syndrome of left ankle 07/06/2014  . Obstructive sleep apnea 05/28/2016  . Nocturnal hypoxemia 05/28/2016  . Fractured nasal bones 07/28/2016  . Blepharitis of right upper eyelid 01/04/2018  . Plantar wart of left foot 11/14/2019   Resolved Ambulatory Problems    Diagnosis Date Noted  . Tear of deltoid ligament of left ankle 07/06/2014  . Tenosynovitis of ankle 07/06/2014   Past Medical History:  Diagnosis Date  . Hyperlipidemia   . Skin rash      Review of Systems See HPI.     Objective:   Physical Exam Vitals reviewed.  Musculoskeletal:       Feet:  Neurological:     General: No focal deficit present.     Mental Status: He is alert and oriented to person, place, and time.  Psychiatric:        Mood and Affect: Mood normal.           Assessment & Plan:  Marland KitchenMarland KitchenDiagnoses and all orders for this visit:  Plantar wart of left foot  Left foot pain -     DG Foot Complete Left   Xray confirms no foreign body.  Has appearance of plantar wart.  Cryotherapy done today.  Use corn pads to take pressure off area.  If not gone consider call back for salicylic acid and pummel stone therapy.   Cryotherapy Procedure Note  Pre-operative Diagnosis: plantar wart  Post-operative Diagnosis: same  Locations: plantar wart left plantar mid lateral foot  Indications: painful  Procedure Details  History of allergy to iodine: no. Pacemaker? no.  Patient  informed of risks (permanent scarring, infection, light or dark discoloration, bleeding, infection, weakness, numbness and recurrence of the lesion) and benefits of the procedure and verbal informed consent obtained.  The areas are treated with liquid nitrogen therapy, frozen until ice ball extended 2 mm beyond lesion, allowed to thaw, and treated again. The patient tolerated procedure well.  The patient was instructed on post-op care, warned that there may be blister formation, redness and pain. Recommend OTC analgesia as needed for pain.  Condition: Stable  Complications: none.  Plan: 1. Instructed to keep the area dry and covered for 24-48h and clean thereafter. 2. Warning signs of infection were reviewed.   3. Recommended that the patient use OTC acetaminophen as needed for pain.

## 2020-01-09 ENCOUNTER — Encounter: Payer: Self-pay | Admitting: Osteopathic Medicine

## 2020-02-05 ENCOUNTER — Ambulatory Visit: Payer: Managed Care, Other (non HMO) | Admitting: Osteopathic Medicine

## 2021-12-12 LAB — HM COLONOSCOPY

## 2021-12-17 IMAGING — DX DG FOOT COMPLETE 3+V*L*
3 series · 3 of 3 positions shown · non-contrast
Comparison: None; correlation LEFT ankle radiographs 01/31/2014

CLINICAL DATA: Pain at fifth metatarsal for 1 year, worse in past 2
weeks

EXAM:
LEFT FOOT - COMPLETE 3+ VIEW

[foot ap]
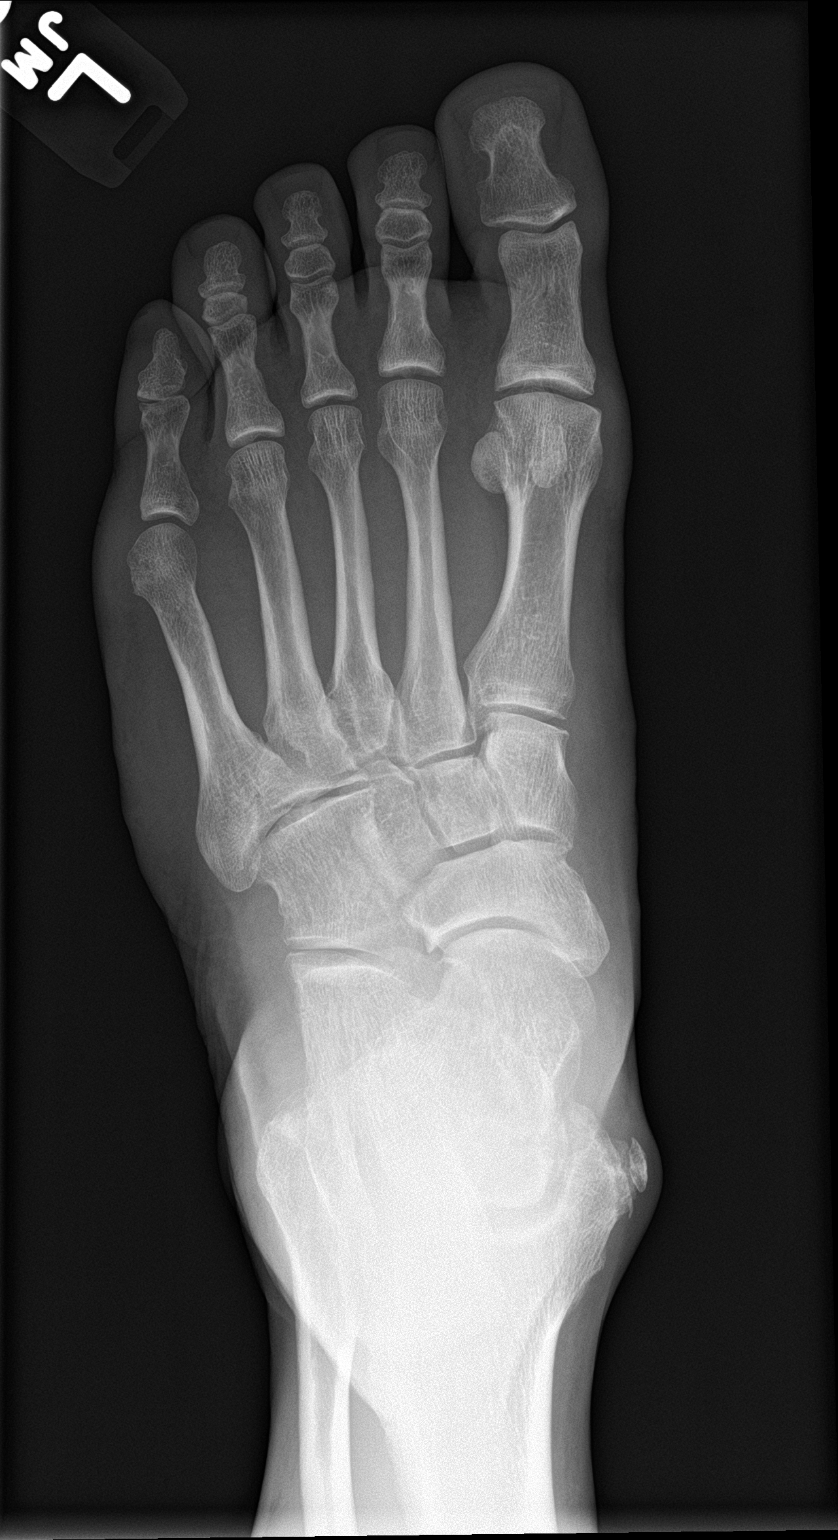

[foot obl]
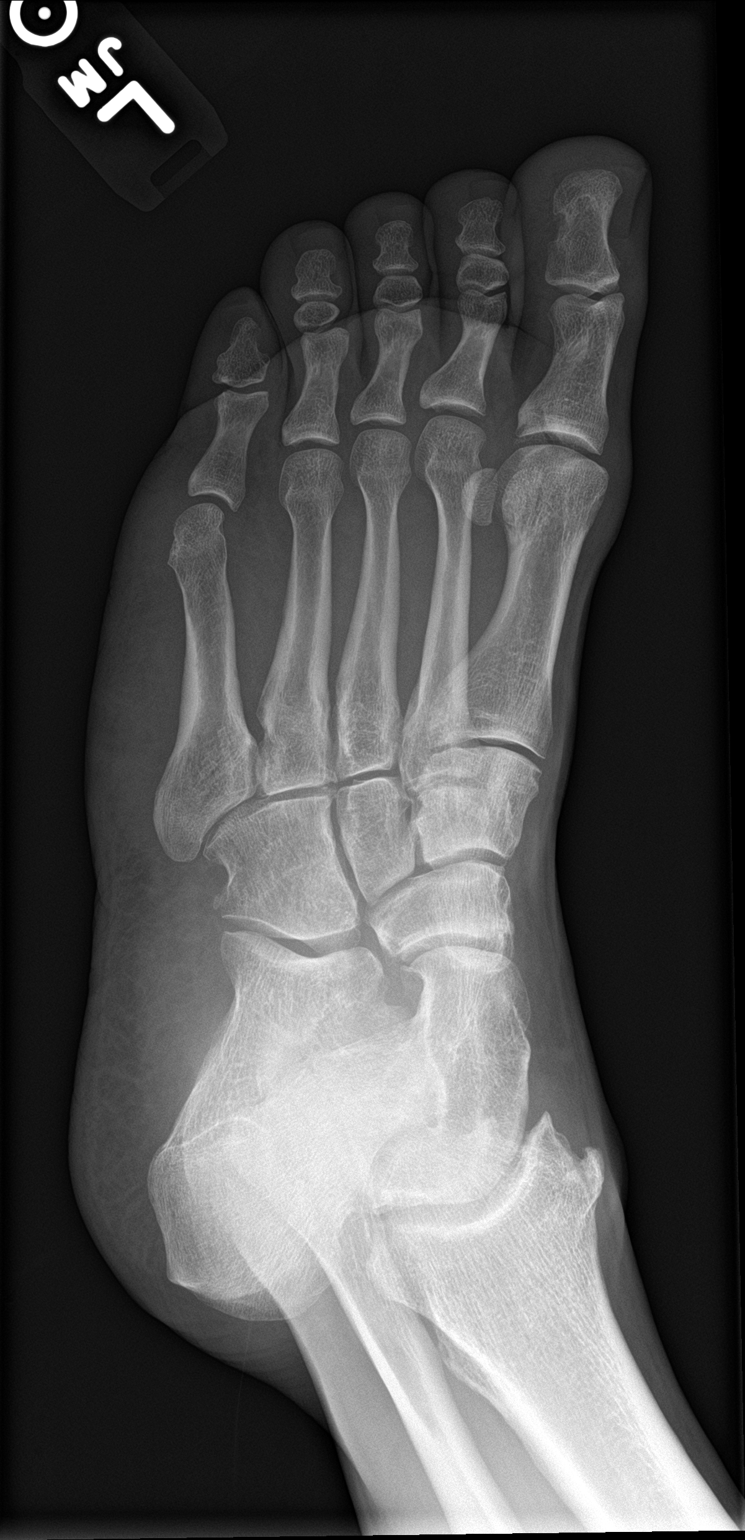

[foot lat]
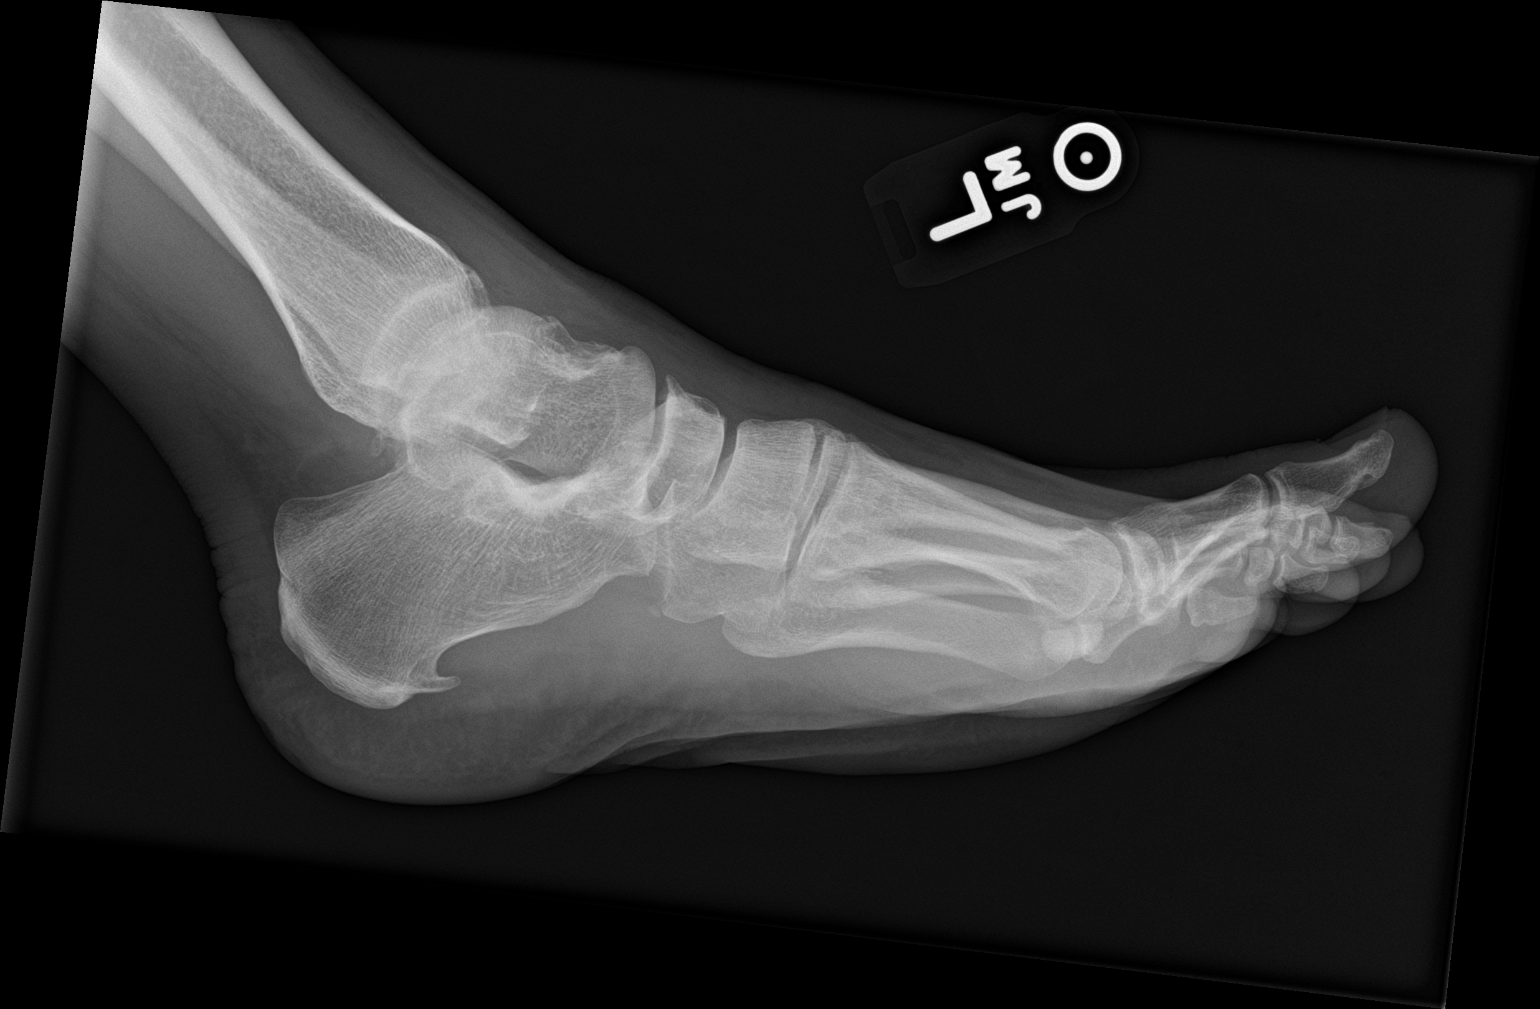

[3 of 3 positions shown; findings below may reference images not displayed]

FINDINGS: Osseous mineralization normal.

Joint spaces preserved.

Small plantar calcaneal spur.

Spurring and non fused ossicle at medial malleolus.

No acute fracture, dislocation, or bone destruction.
IMPRESSION: No acute osseous abnormalities.

Spurring at the calcaneus and medial malleolus.

## 2021-12-25 ENCOUNTER — Encounter: Payer: Self-pay | Admitting: Osteopathic Medicine
